# Patient Record
Sex: Female | Born: 1985 | Race: White | Hispanic: No | Marital: Single | Smoking: Never smoker
Health system: Southern US, Community
[De-identification: ages and names within clinical notes are randomized; demographics above are authoritative.]

## PROBLEM LIST (undated history)

## (undated) ENCOUNTER — Inpatient Hospital Stay: Admission: EM | Payer: Self-pay | Source: Home / Self Care

## (undated) DIAGNOSIS — J302 Other seasonal allergic rhinitis: Secondary | ICD-10-CM

## (undated) DIAGNOSIS — K219 Gastro-esophageal reflux disease without esophagitis: Secondary | ICD-10-CM

## (undated) DIAGNOSIS — Z86718 Personal history of other venous thrombosis and embolism: Secondary | ICD-10-CM

## (undated) DIAGNOSIS — J690 Pneumonitis due to inhalation of food and vomit: Secondary | ICD-10-CM

## (undated) DIAGNOSIS — Z972 Presence of dental prosthetic device (complete) (partial): Secondary | ICD-10-CM

## (undated) DIAGNOSIS — M1990 Unspecified osteoarthritis, unspecified site: Secondary | ICD-10-CM

## (undated) HISTORY — DX: Pneumonitis due to inhalation of food and vomit: J69.0

## (undated) HISTORY — DX: Gastro-esophageal reflux disease without esophagitis: K21.9

---

## 2009-08-30 ENCOUNTER — Ambulatory Visit: Payer: Self-pay | Admitting: Pulmonary Disease

## 2009-08-30 ENCOUNTER — Inpatient Hospital Stay (HOSPITAL_COMMUNITY): Admission: AD | Admit: 2009-08-30 | Discharge: 2009-09-02 | Payer: Self-pay | Admitting: Pulmonary Disease

## 2009-08-31 IMAGING — CR DG CHEST 1V PORT
1 series · 1 of 1 positions shown · non-contrast
Comparison: None.

CLINICAL DATA: Respiratory failure.

PORTABLE CHEST - 1 VIEW

[view not recorded]
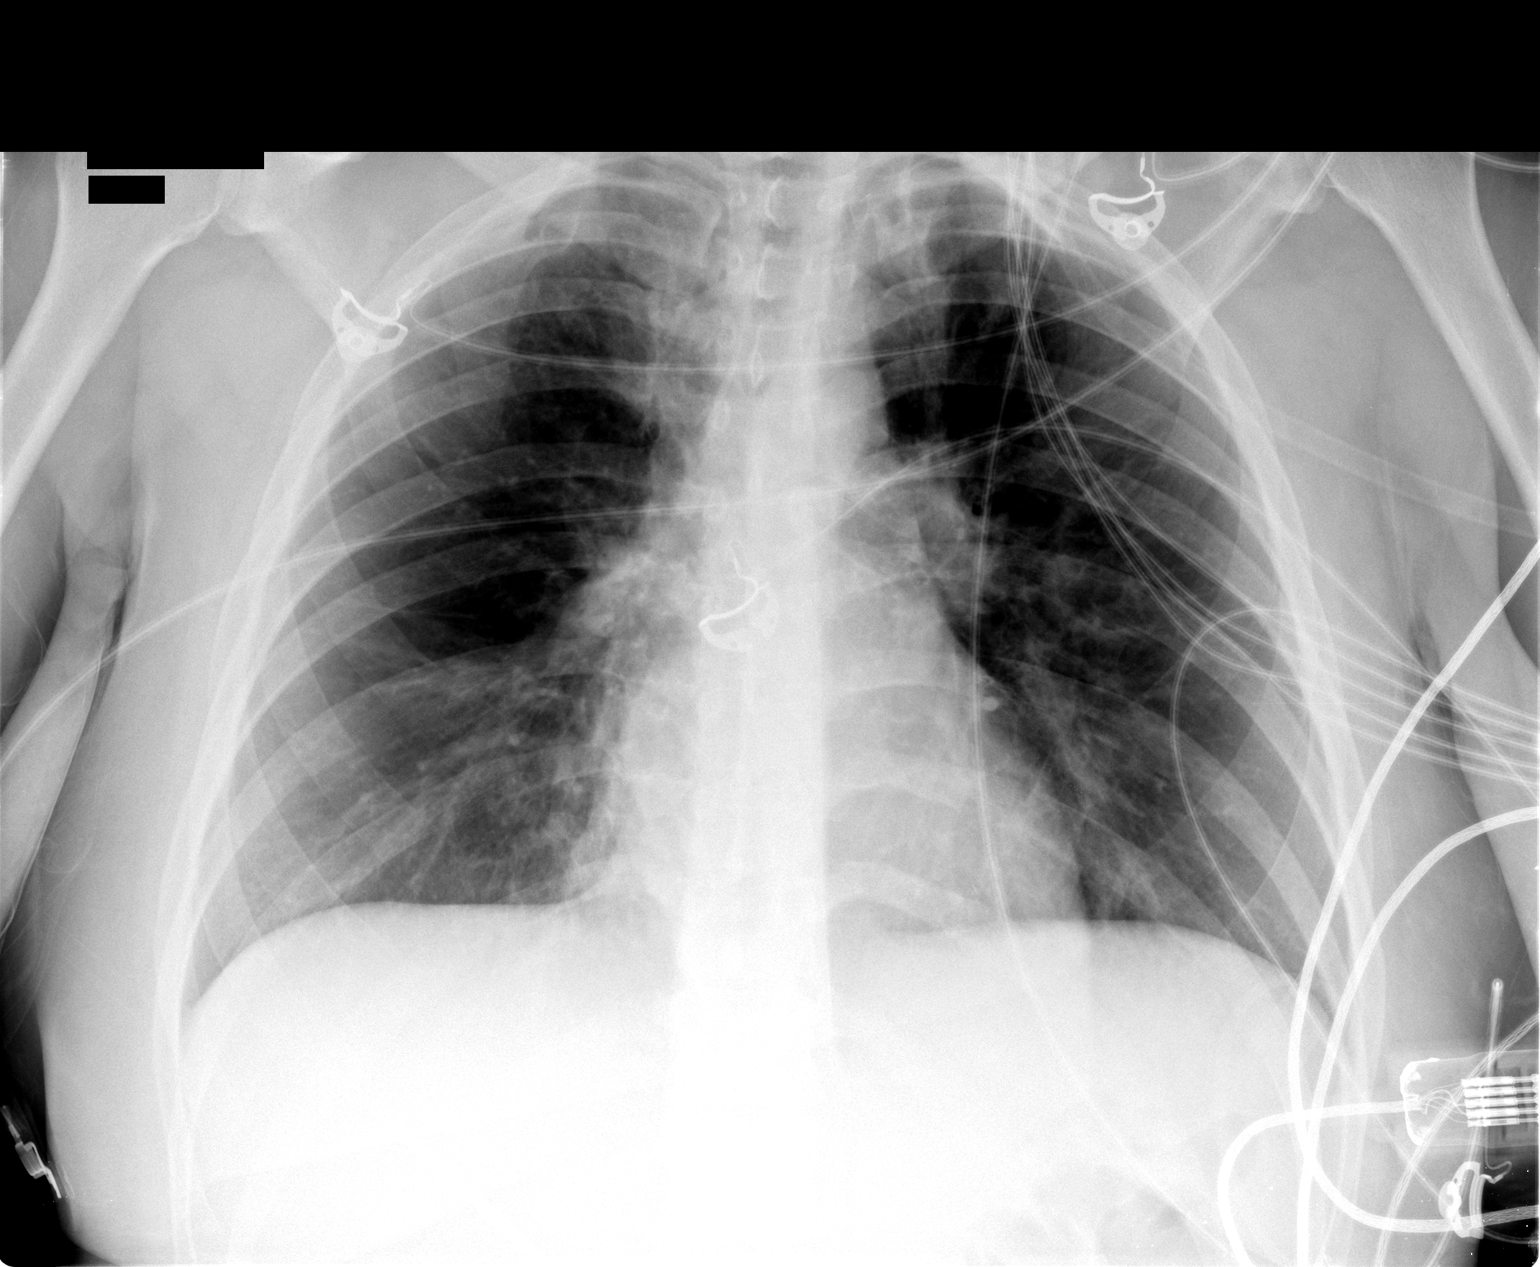

[1 of 1 positions shown; findings below may reference images not displayed]

FINDINGS: Monitoring leads are projected over the chest.  Streaky
medial upper lobe opacities are present as well as medial bibasilar
opacities.  The pattern is suggestive of aspiration pneumonitis.
Cardiopericardial silhouette and mediastinal contours appear within
normal limits.  No pleural fluid.  No pneumothorax.
IMPRESSION: Streaky medial apical and basilar opacities suspicious for
aspiration pneumonitis.

## 2009-09-01 IMAGING — CR DG CHEST 1V PORT
1 series · 1 of 1 positions shown · non-contrast
Comparison: [DATE]

CLINICAL DATA: Respiratory distress

PORTABLE CHEST - 1 VIEW

[view not recorded]
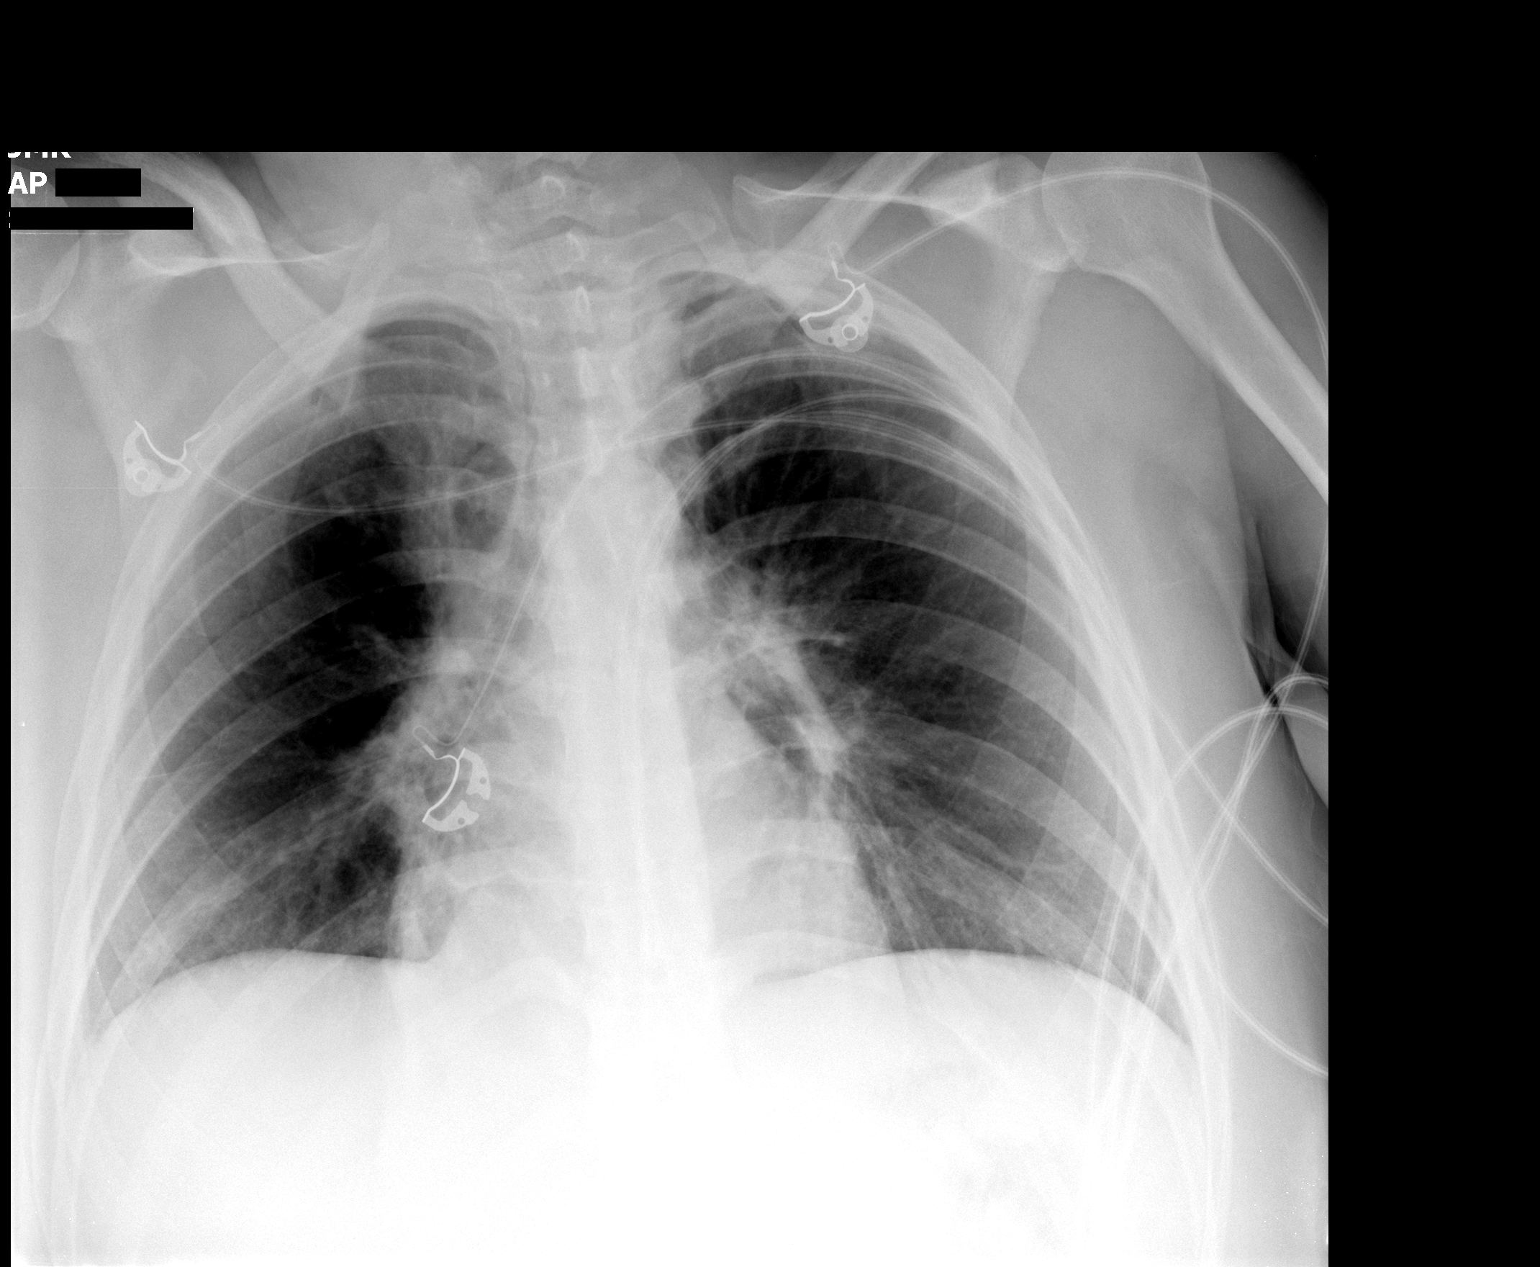

[1 of 1 positions shown; findings below may reference images not displayed]

FINDINGS: Decreased lung volume with slight increase in bibasilar
atelectasis.  There is no heart failure or pneumonia.
IMPRESSION: Increase in bibasilar and perihilar atelectasis.

## 2009-09-02 ENCOUNTER — Telehealth: Payer: Self-pay | Admitting: Emergency Medicine

## 2009-09-20 DIAGNOSIS — K219 Gastro-esophageal reflux disease without esophagitis: Secondary | ICD-10-CM | POA: Insufficient documentation

## 2009-09-20 DIAGNOSIS — J690 Pneumonitis due to inhalation of food and vomit: Secondary | ICD-10-CM

## 2009-09-20 DIAGNOSIS — J45909 Unspecified asthma, uncomplicated: Secondary | ICD-10-CM | POA: Insufficient documentation

## 2009-09-23 ENCOUNTER — Ambulatory Visit: Payer: Self-pay | Admitting: Emergency Medicine

## 2009-09-25 ENCOUNTER — Telehealth (INDEPENDENT_AMBULATORY_CARE_PROVIDER_SITE_OTHER): Payer: Self-pay | Admitting: *Deleted

## 2009-10-28 ENCOUNTER — Ambulatory Visit: Payer: Self-pay | Admitting: Emergency Medicine

## 2009-11-01 ENCOUNTER — Telehealth (INDEPENDENT_AMBULATORY_CARE_PROVIDER_SITE_OTHER): Payer: Self-pay | Admitting: *Deleted

## 2009-11-06 ENCOUNTER — Telehealth (INDEPENDENT_AMBULATORY_CARE_PROVIDER_SITE_OTHER): Payer: Self-pay | Admitting: *Deleted

## 2009-11-19 ENCOUNTER — Telehealth (INDEPENDENT_AMBULATORY_CARE_PROVIDER_SITE_OTHER): Payer: Self-pay | Admitting: *Deleted

## 2009-12-05 ENCOUNTER — Ambulatory Visit: Payer: Self-pay | Admitting: Emergency Medicine

## 2010-01-31 ENCOUNTER — Telehealth: Payer: Self-pay | Admitting: Emergency Medicine

## 2010-02-25 NOTE — Progress Notes (Signed)
Summary: chest pains -  stop Qvar  Phone Note Call from Patient   Caller: Patient Call For: byrum Summary of Call: pt having chest pains  want to know if inhaler could be causing the problem. Initial call taken by: Rickard Patience,  November 01, 2009 1:25 PM  Follow-up for Phone Call        [email or web address]. Carron Curie CMA  November 01, 2009 3:10 PM  Pt was seen by RB on 10.3.11 and started on qvar 2 puffs two times a day.  Pt states for the past 4 mornings she is waking up with right shoulder pain that goes into chest.  Pain in 6-9/10 and hurts "really, really bad."  States pain feels like heartburn and chest pressure.  Pain goes away after approx 2 hours of being up.  States she sleeps on her belly and the pain in mainly in mornings.  Would like to know if this could be coming from qvar and recs.  RB out of the office, Dr. Shelle Iron, pls advise.  Thanks!  Follow-up by: Gweneth Dimitri RN,  November 01, 2009 3:59 PM  Additional Follow-up for Phone Call Additional follow up Details #1::        not coming from qvar.  try sleeping on her back instead of stomach.  can also try prilosec otc one a day for possible heartburn. Additional Follow-up by: Barbaraann Share MD,  November 01, 2009 5:14 PM    Additional Follow-up for Phone Call Additional follow up Details #2::    [email or web address]  Gweneth Dimitri RN  November 01, 2009 5:16 PM  pt called back.  informed her of KC's recs.  Pt states she already has been taking Prilosec OTC once daily x 3 months approx.  And she is already sleeping propped up on her back (although the previous message Crystal took states pt sleeps on her belly) Will forward message to RB for recs. Arman Filter LPN  November 04, 2009 9:02 AM     Not clear to me that this is related to the QVAR, but she should stop it for now to see if this helps. If she improves then we can discuss restarting or possibly choosing an alternative. She should call us to let us know how she does off the med.  Leslye Peer MD  November 04, 2009 10:05 AM   Follow-up by: Leslye Peer MD,  November 04, 2009 10:05 AM  Additional Follow-up for Phone Call Additional follow up Details #3:: Details for Additional Follow-up Action Taken: called and spoke with pt. informed her of RB's response.  Pt states she will stop Qvar and call us in 1 week with a response.  Aundra Millet Reynolds LPN  November 04, 2009 10:16 AM

## 2010-02-25 NOTE — Progress Notes (Signed)
Summary: prescription issue  Phone Note Call from Patient Call back at Home Phone [phone]   Caller: Patient//jennifer Call For: byrum Summary of Call: States daughter was just discharged and Dr. Delton Coombes prescribed protonix 40mg , she can't afford it wants to know if he can substitute OTC med, pls advise.//[facility] Initial call taken by: Darletta Moll,  September 02, 2009 1:38 PM  Follow-up for Phone Call        [facility] Vernie Murders  September 02, 2009 1:52 PM wanted to make rb aware they are using omeprazole 20mg  once daily because they have no insurance and can not affor the protonix because the cost was 198.00 dollars, if this is not ok to try first pls let them know otherwise this is what they are using due to cost it was suggested by the pharmacy because they thought using something would be better than nothing at all. if using the omeprazole is ok they do not need a phone call back Follow-up by: Philipp Deputy CMA,  September 02, 2009 2:14 PM  Additional Follow-up for Phone Call Additional follow up Details #1::        This is fine, she can use the omeprazole and we can discuss effectiveness at hosp f/u visit Additional Follow-up by: Leslye Peer MD,  September 03, 2009 2:24 PM    Additional Follow-up for Phone Call Additional follow up Details #2::    LMOMTCB x 1. Zackery Barefoot CMA  September 03, 2009 2:33 PM   [facility] x 2 Zackery Barefoot CMA  September 04, 2009 9:24 AM   Victorino Dike returned call, I advised her of RB recs, per pt request mailed GERD diet to home address. Pt states she is having to take an additional Omeprazole. I advied to take Take 1 tablet by mouth once a day and one additional as needed. Zackery Barefoot CMA  September 04, 2009 11:36 AM

## 2010-02-25 NOTE — Progress Notes (Signed)
Summary: FYI update on breathing  LMTCBx1  Phone Note Call from Patient   Caller: Patient Call For: byrum Summary of Call: pt states that she is using inhaler in the am and not having any problems with it. Initial call taken by: Rickard Patience,  November 06, 2009 11:17 AM  Follow-up for Phone Call        [facility].  Aundra Millet Reynolds LPN  November 06, 2009 11:42 AM   called and spoke with pt.  pt states she didn't completely stop qvar as instructed by RB per last phone note from 11/01/2009.  Pt states she takes just 1 puff each morning.  She states her "breathing is fine" throughout the day and doesn't need to use rescue hfa when she does this.  She just wanted to verify this was ok with RB.  Will forward message to him to address.  Aundra Millet Reynolds LPN  November 07, 2009 2:12 PM   Additional Follow-up for Phone Call Additional follow up Details #1::        Yes this is ok, continue this dosing until we see each other. Leslye Peer MD  November 08, 2009 10:44 AM  Additional Follow-up by: Leslye Peer MD,  November 08, 2009 10:44 AM    Additional Follow-up for Phone Call Additional follow up Details #2::    LMOMTCBX1.  Just need to inform pt of RB's response.  i've already updated her med list to reflect this change.  Aundra Millet Reynolds LPN  November 08, 2009 11:00 AM   pt aware of dr byrums response Follow-up by: Lacinda Axon,  November 08, 2009 11:06 AM  New/Updated Medications: QVAR 80 MCG/ACT AERS (BECLOMETHASONE DIPROPIONATE) inhale 1 puff once each morning

## 2010-02-25 NOTE — Assessment & Plan Note (Signed)
Summary: asthma   Visit Type:  Follow-up Copy to:  David Stall  CC:  Asthma...breathing has improved on Qvar...only using once daily in the morning ...she c/o chest pain after using it in the evenings...rescue inhaler use has decreased to every 3-4 days vs everyday.  History of Present Illness: 25 yo woman, never smoker, dx with asthma as a child. She had to use SABA as a teen when she played softball. Was admited to [facility] in transfer from [facility] 08/2009 for asthma exac and suspected aspiration pneumonitis associated with hypoxemia. Also w a hx of GERD. Has done fairly well since discharge. She averages using SABA once a day. On at least one occasion she has waked up from deep sleep SOB. This was her first flare that caused hospitalization. Her symptoms feel like someone "squeezing her chest". Happens when weather changes temps or a few hours after she plays softball.   ROV 10/28/09 -- Returns for asthma. Last time we planned to pre-treat exercise and sports. Developed some URI symptoms about 3 weeks ago, no real fever nasal drainage etc.  Can wake her from sleep and respondes to SABA. Had one episode where she had trouble playing softball even after pre-treating.   ROV 12/05/09 -- f/u for her asthma. Last time we started QVAR 80. She has been taking in morning, not at night because she was having chest pain, burning, ? GERD. Using QVAR in the am has decreased her SABA use. She feels better on it.   Current Medications (verified): 1)  Proair Hfa 108 (90 Base) Mcg/act Aers (Albuterol Sulfate) .... 2 Puffs Every 4 Hours As Needed 2)  Omeprazole 20 Mg Cpdr (Omeprazole) .Marland Kitchen.. 1 By Mouth Daily, Can Take Twice Daily As Needed 3)  Qvar 80 Mcg/act Aers (Beclomethasone Dipropionate) .... Inhale 1 Puff Once Each Morning  Allergies (verified): No Known Drug Allergies  Vital Signs:  Patient profile:   25 year old female Height:      65 inches (165.10 cm) Weight:      196 pounds (89.09 kg) BMI:      32.73 O2 Sat:      99 % on Room air Temp:     98.2 degrees F (36.78 degrees C) oral Pulse rate:   88 / minute BP sitting:   124 / 86  (left arm) Cuff size:   regular  Vitals Entered By: Michel Bickers CMA (December 05, 2009 10:53 AM)  O2 Sat at Rest %:  99 O2 Flow:  Room air CC: Asthma...breathing has improved on Qvar...only using once daily in the morning ...she c/o chest pain after using it in the evenings...rescue inhaler use has decreased to every 3-4 days vs everyday Comments Medications reviewed with patient Michel Bickers CMA  December 05, 2009 10:53 AM   Physical Exam  General:  normal appearance and healthy appearing.   Head:  normocephalic and atraumatic Eyes:  conjunctiva and sclera clear Nose:  no deformity, discharge, inflammation, or lesions Mouth:  no deformity or lesions Neck:  no masses, thyromegaly, or abnormal cervical nodes Lungs:  clear bilaterally to auscultation and percussion Heart:  regular rate and rhythm, S1, S2 without murmurs, rubs, gallops, or clicks Abdomen:  not examined Msk:  no deformity or scoliosis noted with normal posture Extremities:  no clubbing, cyanosis, edema, or deformity noted Neurologic:  non-focal Skin:  intact without lesions or rashes Psych:  alert and cooperative; normal mood and affect; normal attention span and concentration   Impression & Recommendations:  Problem # 1:  ASTHMA (ICD-493.90) Continue qvar am. If we need to escalate the qvar, may need to also escalate rx for GERD ProAir as needed, will likely need to use prior to softball games rov 6 months or as needed   Problem # 2:  G E R D (ICD-530.81)  Her updated medication list for this problem includes:    Omeprazole 20 Mg Cpdr (Omeprazole) .Marland Kitchen... 1 by mouth daily, can take twice daily as needed  Other Orders: Est. Patient Level III (16109)  Patient Instructions: 1)  Please continue your QVAR , 1 puff every morning.  2)  Use your ProAir as needed  3)  Follow  up with Dr Delton Coombes in 6 months or as needed  Prescriptions: QVAR 80 MCG/ACT AERS (BECLOMETHASONE DIPROPIONATE) inhale 1 puff once each morning  #1 x 11   Entered and Authorized by:   Leslye Peer MD   Signed by:   Leslye Peer MD on 12/05/2009   Method used:   [facility]        [phone][facility]ail[facility]       [address]       [place], [place]  [address]       Ph: [phone] or [phone]       Fax: [phone]   RxID:   [id number]

## 2010-02-25 NOTE — Progress Notes (Signed)
Summary: team sports question/ asthma  Phone Note Call from Patient Call back at Home Phone [phone]   Caller: Patient Call For: BYRUM Summary of Call: pt wants to play volleyball- is this ok?  Initial call taken by: Tivis Ringer, CNA,  September 25, 2009 10:30 AM  Follow-up for Phone Call        Spoke with pt.  She states that she wants to start playing volleyball in about 2 wks, already plays softball and she is afraid that this might be too much for her and exacerbate asthma symptoms.  Pt aware RB out of the office until 09/26/09.  Pls advise thanks! Follow-up by: Vernie Murders,  September 25, 2009 10:39 AM  Additional Follow-up for Phone Call Additional follow up Details #1::        I think this is OK, but she needs to pre-treat her games with albuterol 2 puffs (just like she would softball). Leslye Peer MD  September 26, 2009 5:39 PM  Additional Follow-up by: Leslye Peer MD,  September 26, 2009 5:39 PM    Additional Follow-up for Phone Call Additional follow up Details #2::    Spoke with pt and notified of recs per RB.  Pt verbalized understanding. Follow-up by: Vernie Murders,  September 26, 2009 5:42 PM

## 2010-02-25 NOTE — Assessment & Plan Note (Signed)
Summary: asthma   Visit Type:  Follow-up Copy to:  David Stall  CC:  Asthma follow-up with PFT's...the patient c/o increased SOB and a dry cough with the change in weather.  History of Present Illness: 25 yo woman, never smoker, dx with asthma as a child. She had to use SABA as a teen when she played softball. Was admited to [facility] in transfer from [facility] 08/2009 for asthma exac and suspected aspiration pneumonitis associated with hypoxemia. Also w a hx of GERD. Has done fairly well since discharge. She averages using SABA once a day. On at least one occasion she has waked up from deep sleep SOB. This was her first flare that caused hospitalization. Her symptoms feel like someone "squeezing her chest". Happens when weather changes temps or a few hours after she plays softball.   ROV 10/28/09 -- Returns for asthma. Last time we planned to pre-treat exercise and sports. Developed some URI symptoms about 3 weeks ago, no real fever nasal drainage etc.  Can wake her from sleep and respondes to SABA. Had one episode where she had trouble playing softball even after pre-treating.   Current Medications (verified): 1)  Proair Hfa 108 (90 Base) Mcg/act Aers (Albuterol Sulfate) .... 2 Puffs Every 4 Hours As Needed 2)  Omeprazole 20 Mg Cpdr (Omeprazole) .Marland Kitchen.. 1 By Mouth Daily, Can Take Twice Daily As Needed  Allergies (verified): No Known Drug Allergies  Vital Signs:  Patient profile:   25 year old female Height:      65 inches (165.10 cm) Weight:      192 pounds (87.27 kg) BMI:     32.07 O2 Sat:      96 % on Room air Temp:     97.9 degrees F (36.61 degrees C) oral Pulse rate:   109 / minute BP sitting:   116 / 84  (left arm) Cuff size:   regular  Vitals Entered By: Michel Bickers CMA (October 28, 2009 11:10 AM)  O2 Sat at Rest %:  96 O2 Flow:  Room air CC: Asthma follow-up with PFT's...the patient c/o increased SOB and a dry cough with the change in weather Comments Medications reviewed with  patient Daytime phone verified. Michel Bickers [facility]  October 28, 2009 11:11 AM    Pulmonary Function Test Date: 10/28/2009 Height (in.): 63 Gender: Female  Pre-Spirometry FVC    Value: 3.62 L/min   Pred: 3.77 L/min     % Pred: 96 % FEV1    Value: 2.35 L     Pred: 3.02 L     % Pred: 78 % FEV1/FVC  Value: 65 %     Pred: 80 %    FEF 25-75  Value: 1.21 L/min   Pred: 3.55 L/min     % Pred: 34 %  Post-Spirometry FVC    Value: 3.55 L/min   Pred: 3.77 L/min     % Pred: 94 % FEV1    Value: 2.49 L     Pred: 3.02 L     % Pred: 82 % FEV1/FVC  Value: 70 %     Pred: 80 %    FEF 25-75  Value: 1.63 L/min   Pred: 3.55 L/min     % Pred: 46 %  Lung Volumes TLC    Value: 4.00 L   % Pred: 81 % RV    Value: 0.39 L   % Pred: 27 % DLCO    Value: 21.2 %   % Pred:  79 % DLCO/VA  Value: 6.09 %   % Pred: 134 %  Comments: Moderate AFL, no significant change after BD. ? restriction of volumes, normal DLCO. RSB  Impression & Recommendations:  Problem # 1:  ASTHMA (ICD-493.90) Frequent breakthru symptoms, even after pre-treating exercise.  - start ICS today - QVAR 80 two times a day  - as needed SABA and pre-treat exercise.  - offered flu shot, she declined  Medications Added to Medication List This Visit: 1)  Omeprazole 20 Mg Cpdr (Omeprazole) .Marland Kitchen.. 1 by mouth daily, can take twice daily as needed  Other Orders: Est. Patient Level IV (57846)  Patient Instructions: 1)  We will start QVAR , 1 puff two times a day  2)  Continue to use ProAir up to every 4 hours if needed for shortness of breath, and to pre-treat 15 minutes before your exercise.  3)  Follow up with Dr Delton Coombes in 6 weeks or as needed  4)  You would probably benefit from the flu shot. Call our office to get this if you change your mind.    Immunization History:  Pneumovax Immunization History:    Pneumovax:  historical (09/02/2009)

## 2010-02-25 NOTE — Miscellaneous (Signed)
Summary: Orders Update pft charges  Clinical Lists Changes  Orders: Added new Service order of Carbon Monoxide diffusing w/capacity (94720) - Signed Added new Service order of Lung Volumes (94240) - Signed Added new Service order of Spirometry (Pre & Post) (94060) - Signed 

## 2010-02-25 NOTE — Progress Notes (Signed)
Summary: rx-proair  Phone Note From Pharmacy   Caller: [facility] #[id number]*--[phone]--Kenzel Ruesch Call For: clance  Reason for Call: Needs renewal Summary of Call: [facility] not [facility] any refills on proair.  Requesting refill Initial call taken by: Lehman Prom,  November 19, 2009 2:06 PM  Follow-up for Phone Call        pt is RB's patient; it appears proair was given when [facility] was on-call one weekend.  [facility] gave verbal to [facility] for proair refills x2, to be put under RB's name from now on. Boone Master CNA/MA  November 19, 2009 2:15 PM     Prescriptions: PROAIR HFA 108 (90 BASE) MCG/ACT AERS (ALBUTEROL SULFATE) 2 puffs every 4 hours as needed  #1 x 2   Entered by:   Boone Master CNA/MA   Authorized by:   Leslye Peer MD   Signed by:   Boone Master CNA/MA on 11/19/2009   Method used:   Telephoned to ...       [facility] [phone][facility] (retail)       [address]       [place], [place]  [address]       Ph: [phone] or [phone]       Fax: [phone]   RxID:   [id number]

## 2010-02-25 NOTE — Assessment & Plan Note (Signed)
Summary: asthma, GERD   Visit Type:  Hospital Follow-up Copy to:  David Stall  CC:  HFU for asthma and hypoxia.  The patient c/o chest tightness...she denies any cough or SOB or wheezing.  History of Present Illness: 25 yo woman, never smoker, dx with asthma as a child. She had to use SABA as a teen when she played softball. Was admited to [facility] in transfer from [facility] 08/2009 for asthma exac and suspected aspiration pneumonitis associated with hypoxemia. Also w a hx of GERD. Has done fairly well since discharge. She averages using SABA once a day. On at least one occasion she has waked up from deep sleep SOB. This was her first flare that caused hospitalization. Her symptoms feel like someone "squeezing her chest". Happens when weather changes temps or a few hours after she plays softball.   Preventive Screening-Counseling & Management  Alcohol-Tobacco     Alcohol drinks/day: 0     Smoking Status: never  Current Medications (verified): 1)  Proair Hfa 108 (90 Base) Mcg/act Aers (Albuterol Sulfate) .... 2 Puffs Every 4 Hours As Needed 2)  Tessalon Perles 100 Mg Caps (Benzonatate) .Marland Kitchen.. 1 By Mouth Every 8 Hours As Needed For Cough  Allergies (verified): No Known Drug Allergies  Past History:  Past Surgical History: none  Family History: Family History MI/Heart Attack---MGM Family History Asthma---father and paternal uncle Family HIstory Thyroid Disease---MGM  Social History: Patient never smoked.  No ETOH use Works at [employer], has done outdoor camps, afterschool care.  Lives w her Mom Smoking Status:  never Alcohol drinks/day:  0  Review of Systems       The patient complains of non-productive cough.  The patient denies shortness of breath with activity, shortness of breath at rest, productive cough, coughing up blood, chest pain, irregular heartbeats, acid heartburn, indigestion, loss of appetite, weight change, abdominal pain, difficulty swallowing, sore throat, tooth/dental  problems, headaches, nasal congestion/difficulty breathing through nose, sneezing, itching, ear ache, anxiety, depression, hand/feet swelling, joint stiffness or pain, rash, change in color of mucus, and fever.         Reliably gets cough in the early fall months, no real PND.   Vital Signs:  Patient profile:   25 year old female Height:      65 inches (165.10 cm) Weight:      189 pounds (85.91 kg) BMI:     31.56 O2 Sat:      97 % on Room air Temp:     98.2 degrees F (36.78 degrees C) oral Pulse rate:   93 / minute BP sitting:   110 / 68  (left arm) Cuff size:   regular  Vitals Entered By: Michel Bickers CMA (September 23, 2009 2:53 PM)  O2 Sat at Rest %:  97 O2 Flow:  Room air CC: HFU for asthma and hypoxia.  The patient c/o chest tightness...she denies any cough or SOB or wheezing Is Patient Diabetic? No Comments Medications reviewed with the patient. Daytime phone verified. Michel Bickers [facility]  September 23, 2009 2:54 PM   Physical Exam  General:  normal appearance and healthy appearing.   Head:  normocephalic and atraumatic Eyes:  conjunctiva and sclera clear Nose:  no deformity, discharge, inflammation, or lesions Mouth:  no deformity or lesions Neck:  no masses, thyromegaly, or abnormal cervical nodes Lungs:  clear bilaterally to auscultation and percussion Heart:  regular rate and rhythm, S1, S2 without murmurs, rubs, gallops, or clicks Abdomen:  not examined Msk:  no deformity or scoliosis noted with normal posture Extremities:  no clubbing, cyanosis, edema, or deformity noted Neurologic:  non-focal Skin:  intact without lesions or rashes Psych:  alert and cooperative; normal mood and affect; normal attention span and concentration   Impression & Recommendations:  Problem # 1:  ASTHMA (ICD-493.90) Continue to have your Proventil available to use up to every 4 hours as needed for shortness of breath.  Take your Proventil 2 puffs 20 minutes prior to your softball games.  We  will perform full pulmonary function testing You should try to cut down on your caffeine use.  We will consider starting an every-day medication for asthma at your next visit.  Problem # 2:  G E R D (ICD-530.81) Continue your omeprazole once daily   Other Orders: Consultation Level IV (18841)  Patient Instructions: 1)  Continue your omeprazole once daily  2)  Continue to have your Proventil available to use up to every 4 hours as needed for shortness of breath.  3)  Take your Proventil 2 puffs 20 minutes prior to your softball games.  4)  We will perform full pulmonary function testing 5)  You should try to cut down on your caffeine use.  6)  We will consider starting an every-day medication for asthma at your next visit.

## 2010-02-27 NOTE — Progress Notes (Signed)
Summary: prescription-LMTCBx1  Phone Note Call from Patient Call back at Home Phone [phone]   Caller: Patient Call For: Dr. Delton Coombes Summary of Call: Patient phoned the sample of Qvar help her a lot but it was $149.50 with the discount card and she doesn't have insurance. She wants to know if there was an inhailer less expensive than the Qvar 80 mg that she could try. She can be reached at [phone] Initial call taken by: Vedia Coffer,  January 31, 2010 9:16 AM  Follow-up for Phone Call        Lmtcbx1. to offer pt samples until RB is back in office to advise on change.  Carron Curie CMA  January 31, 2010 9:49 AM  I have given pt sample of qvar . I advsied I will forward message to RB to address on possible alternative. Carron Curie CMA  January 31, 2010 10:18  Can we get financial assisatance for QVAR? Or alternatively, can we find out the cheapest inhaled corticosteroid (or one that has financial assistance?) Thanks you. Leslye Peer MD  February 04, 2010 8:51 PM    Follow-up by: Leslye Peer MD,  February 04, 2010 8:51 PM  Additional Follow-up for Phone Call Additional follow up Details #1::        We do have financial assistance forms for qvar- I have filled out our portion of the form with the exception of RB's signature.  I will have him sign this PM. Spoke with pt and advised her of this.  She will come and pick up the form tommorrow and pt states that she still has plenty of her sample left so this is not needed.    Additional Follow-up by: Vernie Murders,  February 05, 2010 9:22 AM    Additional Follow-up for Phone Call Additional follow up Details #2::    Thanks very much I will sign the form Leslye Peer MD  February 05, 2010 4:44 PM  Follow-up by: Leslye Peer MD,  February 05, 2010 4:44 PM  Additional Follow-up for Phone Call Additional follow up Details #3:: Details for Additional Follow-up Action Taken: leslie--do you still have these  forms? Randell Loop [facility]  February 05, 2010 4:49 PM

## 2010-03-04 ENCOUNTER — Telehealth: Payer: Self-pay | Admitting: Emergency Medicine

## 2010-03-06 ENCOUNTER — Encounter: Payer: Self-pay | Admitting: Adult Health

## 2010-03-06 ENCOUNTER — Ambulatory Visit (INDEPENDENT_AMBULATORY_CARE_PROVIDER_SITE_OTHER)
Admission: RE | Admit: 2010-03-06 | Discharge: 2010-03-06 | Disposition: A | Discharge status: Home / Self Care | Payer: Self-pay | Source: Ambulatory Visit | Attending: Emergency Medicine | Admitting: Emergency Medicine

## 2010-03-06 ENCOUNTER — Telehealth: Payer: Self-pay | Admitting: Adult Health

## 2010-03-06 ENCOUNTER — Ambulatory Visit (INDEPENDENT_AMBULATORY_CARE_PROVIDER_SITE_OTHER): Payer: Self-pay | Admitting: Adult Health

## 2010-03-06 ENCOUNTER — Other Ambulatory Visit: Payer: Self-pay | Admitting: Emergency Medicine

## 2010-03-06 DIAGNOSIS — K219 Gastro-esophageal reflux disease without esophagitis: Secondary | ICD-10-CM

## 2010-03-06 DIAGNOSIS — R0789 Other chest pain: Secondary | ICD-10-CM

## 2010-03-06 DIAGNOSIS — J45909 Unspecified asthma, uncomplicated: Secondary | ICD-10-CM

## 2010-03-06 IMAGING — CR DG CHEST 2V
2 series · 2 of 2 positions shown · non-contrast
Comparison: [DATE]

CLINICAL DATA: Chest pain and shortness of breath.

CHEST - 2 VIEW

[view not recorded (1 of 2)]
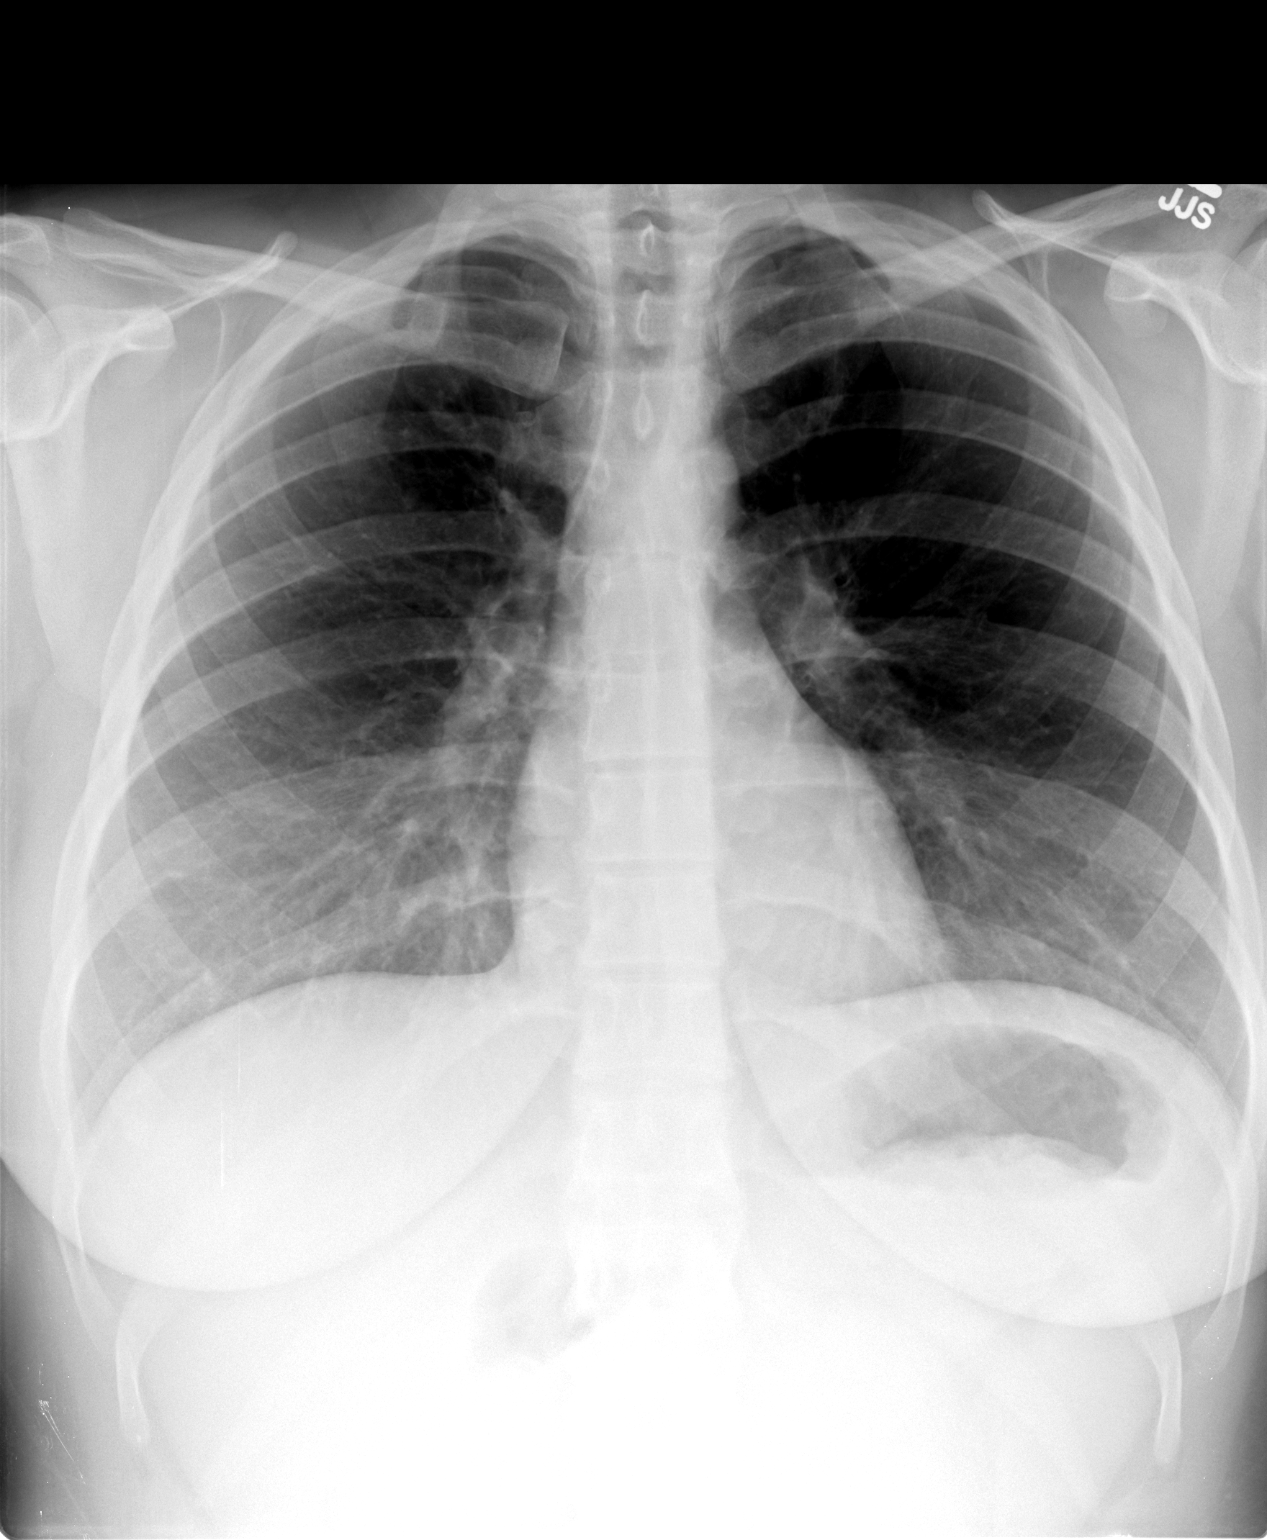

[view not recorded (2 of 2)]
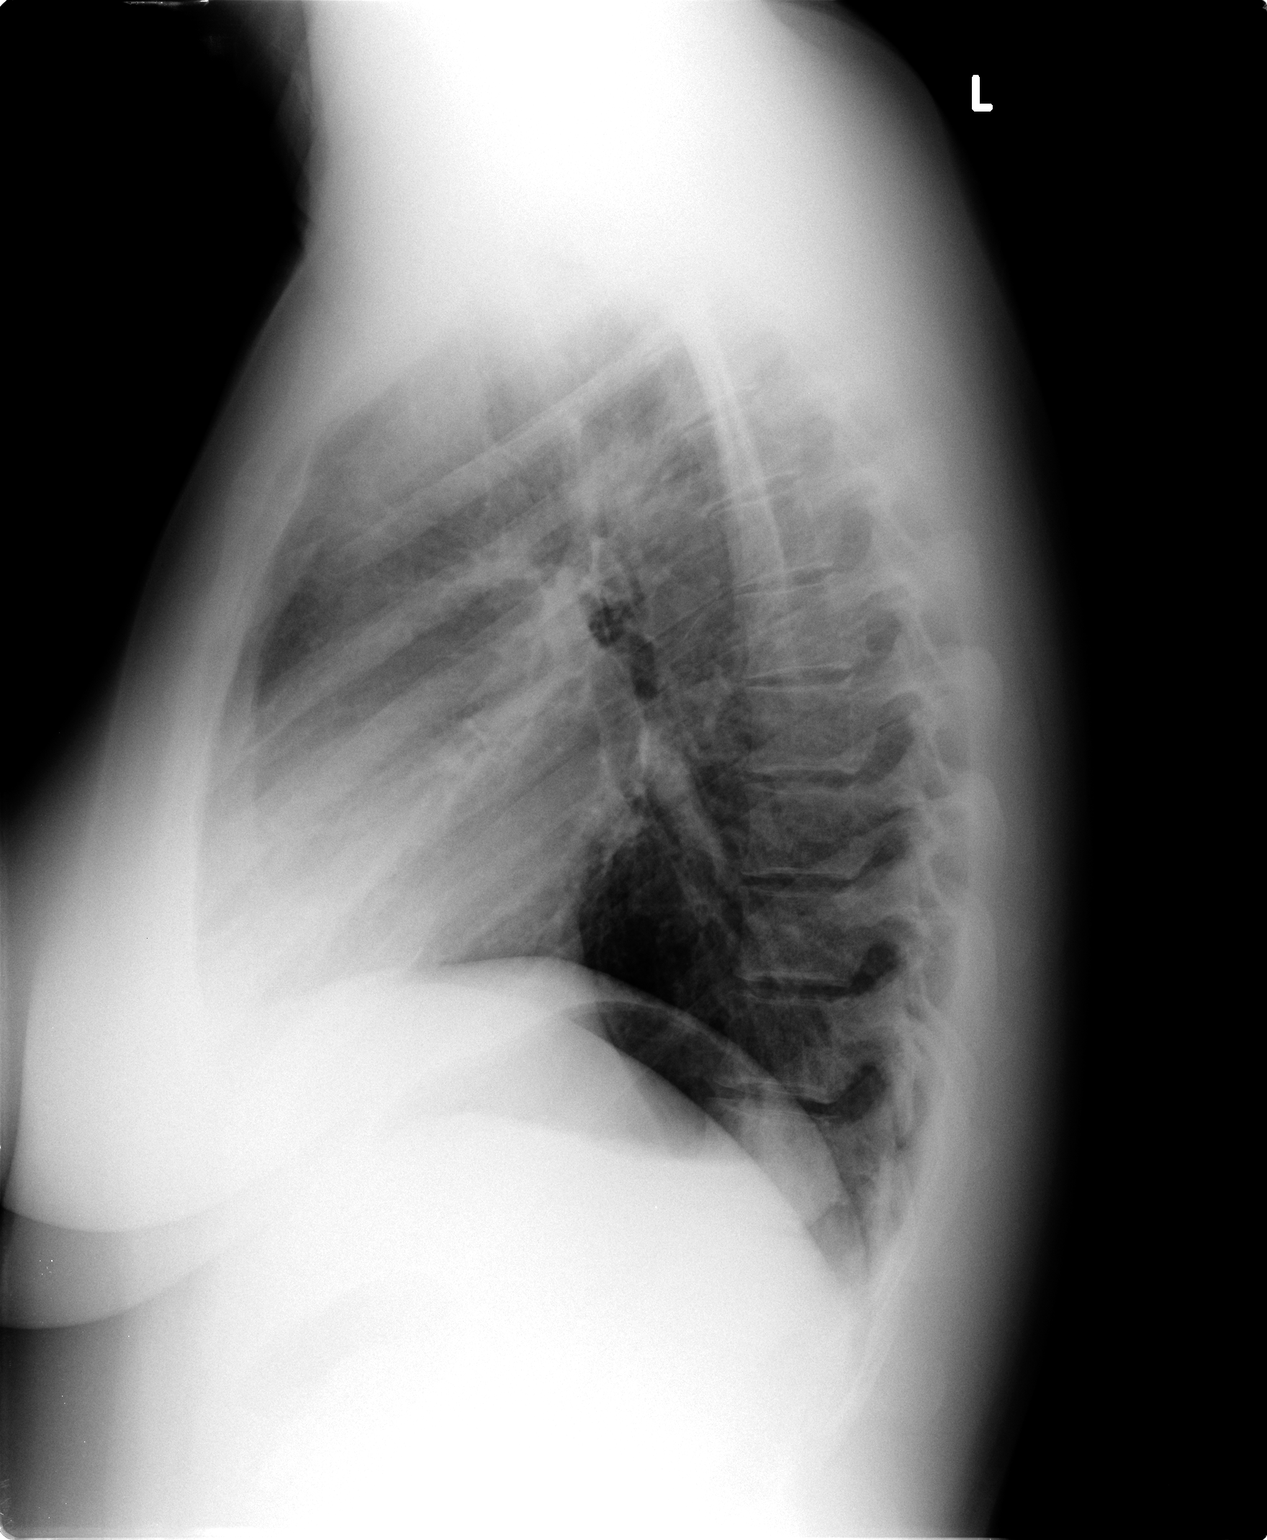

[2 of 2 positions shown; findings below may reference images not displayed]

FINDINGS: The cardiomediastinal silhouette is unremarkable.
Mild peribronchial thickening again noted.
There is no evidence of focal airspace disease, pulmonary edema,
pulmonary nodule/mass, pleural effusion, or pneumothorax.
No acute bony abnormalities are identified.
IMPRESSION: No evidence of acute cardiopulmonary disease.

Mild chronic peribronchial thickening.

## 2010-03-07 ENCOUNTER — Telehealth: Payer: Self-pay | Admitting: Adult Health

## 2010-03-10 ENCOUNTER — Telehealth: Payer: Self-pay | Admitting: Adult Health

## 2010-03-13 NOTE — Progress Notes (Addendum)
Summary: Stabbing chest pain  Phone Note Call from Patient   Caller: Patient Summary of Call: Patient called to report stabbing pains in her chest.  She was seen for chest pain yesterday and was told she had heartburn.  Transferred call to Michel Bickers in Triage. Initial call taken by: Leonette Monarch,  March 07, 2010 10:47 AM  Follow-up for Phone Call        Pt states the chest pain is getting worse since OV on 03/06/2010. She has tried the Mylanta, Prilosec two times a day, using Qvar as instructed and watching her diet. She states it feels like someone is "stabbing" her in the chest and the pain settles into her right shoulder as well. Pt would like to know if there is anything else to try and ease the pain. RB out of the office and will send msg to TP since pt was seen by her in office on 03/06/2010. Pls advise. Follow-up by: Michel Bickers CMA,  March 07, 2010 10:56 AM  Additional Follow-up for Phone Call Additional follow up Details #1::        spoke with pt she is not feeling any better  has taken ppi two times a day and mylanta  pain is getting worse CXR with no acute changes  EKG with no acute findings.  she was advised to go to ER as this does not sound lke reflux will need further eval.  pt verbalized understanding    Additional Follow-up by: Tammy Parrett NP,  March 07, 2010 11:07 AM

## 2010-03-13 NOTE — Progress Notes (Signed)
Summary: heartburn  Phone Note Call from Patient Call back at Home Phone [phone]   Caller: Patient Call For: byrum Summary of Call: pt c/o "burning in middle of chest" that goes to her right shoulder. this has been going on all day. she has taken omeprazole. wants to know if she should take another or what does dr rec? no N or V. no blurred vision. pt did, however, eat eggs and sausage this morning but says she ate the same last week and didn't have heartburn at that time. [facility] [place] Initial call taken by: Tivis Ringer, CNA,  March 04, 2010 2:01 PM  Follow-up for Phone Call        Spoke wiht pt and she is c/o burning in her chest that has been going on all day today. She also c/o increased wheezing and chest tightness. She relates this to heartburn. i asked if she feel slike she is having heartburn symptoms and she states she has never had heartburn so shes not sure.  She then states she has had some SOB as well and has had to increase use of rescue inhaler. I advised pt that this might be start of infection so I advise an appt. pt offered appt tomorrow but she refused stating she has to work. Pt set to see TP on thursday at 9am. Carron Curie CMA  March 04, 2010 4:23 PM

## 2010-03-13 NOTE — Progress Notes (Signed)
Summary: speak to nurse  Phone Note Call from Patient Call back at Home Phone [phone]   Caller: Patient Call For: Etheline Geppert Reason for Call: Talk to Nurse Complaint: Urinary/GYN Problems Summary of Call: Pt wants to know where she can find a cook book for people with gerd. Initial call taken by: Darletta Moll,  March 06, 2010 3:29 PM  Follow-up for Phone Call        Advised pt to check at [employer] for cookbook. Carron Curie CMA  March 06, 2010 4:12 PM

## 2010-03-13 NOTE — Assessment & Plan Note (Addendum)
Summary: Acute NP office visit - heartburn/tachycardia   Copy to:  David Stall  CC:  intermittent burning in chest x2days and and states noticed tachycardia w/ rest x1day.  History of Present Illness: 25 yo woman, never smoker, dx with asthma as a child. She had to use SABA as a teen when she played softball. Was admited to [facility] in transfer from [facility] 08/2009 for asthma exac and suspected aspiration pneumonitis associated with hypoxemia. Also w a hx of GERD. Has done fairly well since discharge. She averages using SABA once a day. On at least one occasion she has waked up from deep sleep SOB. This was her first flare that caused hospitalization. Her symptoms feel like someone "squeezing her chest". Happens when weather changes temps or a few hours after she plays softball.   ROV 10/28/09 -- Returns for asthma. Last time we planned to pre-treat exercise and sports. Developed some URI symptoms about 3 weeks ago, no real fever nasal drainage etc.  Can wake her from sleep and respondes to SABA. Had one episode where she had trouble playing softball even after pre-treating.   ROV 12/05/09 -- f/u for her asthma. Last time we started QVAR 80. She has been taking in morning, not at night because she was having chest pain, burning, ? GERD. Using QVAR in the am has decreased her SABA use. She feels better on it.   March 06, 2010 --Presents for an acute office visit. Complains of intermittent burning in chest x 2 days, and states noticed tachycardia w/ rest x1day. Had severe episode of GERD w/suspected aspiration pneumonitis w/ admission in 08/2009. She says she has on/off burning in chest for last 4 months. Taking Prilosec once daily .Has had more intermittent wheezing and used her proair 3 x this past week. This am ate  sausage biscuit this morning with increased burning in chest. Denies orthopnea, hemoptysis, fever, n/v/d, edema, headache, calf pain, syncope , abdominal pain or bloody symptoms.    Medications Prior to Update: 1)  Proair Hfa 108 (90 Base) Mcg/act Aers (Albuterol Sulfate) .... 2 Puffs Every 4 Hours As Needed 2)  Omeprazole 20 Mg Cpdr (Omeprazole) .Marland Kitchen.. 1 By Mouth Daily, Can Take Twice Daily As Needed 3)  Qvar 80 Mcg/act Aers (Beclomethasone Dipropionate) .... Inhale 1 Puff Once Each Morning  Current Medications (verified): 1)  Proair Hfa 108 (90 Base) Mcg/act Aers (Albuterol Sulfate) .... 2 Puffs Every 4 Hours As Needed 2)  Omeprazole 20 Mg Cpdr (Omeprazole) .Marland Kitchen.. 1 By Mouth Daily, Can Take Twice Daily As Needed 3)  Qvar 80 Mcg/act Aers (Beclomethasone Dipropionate) .... Inhale 1 Puff Once Each Morning  Allergies (verified): 1)  ! Rocephin  Past History:  Past Medical History: Last updated: 09/20/2009 Current Problems:  ASPIRATION PNEUMONIA (ICD-507.0) G E R D (ICD-530.81) ASTHMA (ICD-493.90)  Family History: Last updated: 09/23/2009 Family History MI/Heart Attack---MGM Family History Asthma---father and paternal uncle Family HIstory Thyroid Disease---MGM  Social History: Last updated: 03/06/2010 Patient never smoked.  No ETOH use Works at [employer], has done outdoor camps, afterschool care.  Lives w her Mom declines in flu shot 03-06-10  Risk Factors: Smoking Status: never (09/23/2009)  Social History: Patient never smoked.  No ETOH use Works at [employer], has done outdoor camps, afterschool care.  Lives w her Mom declines in flu shot 03-06-10  Review of Systems      See HPI  Vital Signs:  Patient profile:   25 year old female Height:  65 inches Weight:      196.38 pounds BMI:     32.80 O2 Sat:      98 % on Room air Temp:     97.2 degrees F oral Pulse rate:   84 / minute BP sitting:   122 / 80  (left arm) Cuff size:   regular  Vitals Entered By: Boone Master CNA/MA (March 06, 2010 10:53 AM)  O2 Flow:  Room air  CC: intermittent burning in chest x2days, and states noticed tachycardia w/ rest x1day Is Patient Diabetic? No Comments  Medications reviewed with patient Daytime contact number verified with patient. Boone Master CNA/MA  March 06, 2010 10:51 AM    Physical Exam  Additional Exam:  GEN: A/Ox3; pleasant , NAD HEENT:  [place]/AT, , EACs-clear, TMs-wnl, NOSE-clear, THROAT-clear NECK:  Supple w/ fair ROM; no JVD; normal carotid impulses w/o bruits; no thyromegaly or nodules palpated; no lymphadenopathy. RESP  Clear to P & A; w/o, wheezes/ rales/ or rhonchi. CARD:  RRR, no m/r/g   GI:   Soft & nt; nml bowel sounds; no organomegaly or masses detected. [place]: Warm bil,  no calf tenderness edema, clubbing, pulses intact Neuro: intact w/ no focal deficits noted.  EKG w/ no acute changes    Impression & Recommendations:  Problem # 1:  ASTHMA (ICD-493.90)  Mild flare with suspected GERD flare  EKG with ;no acute changes.  XRay no acute changes  Plan:  Increase QVAR 2 puffs once daily  Mylanta as needed heartburn.  Increase Prilosec 20mg  two times a day  GERD diet  Avoid eating 3-4 hr prior to bedtime.  follow up Dr. Delton Coombes in 3 weeks  Please contact office for sooner follow up if symptoms do not improve or worsen   Orders: T-2 View CXR (71020TC) Est. Patient Level IV (32440)  Problem # 2:  G E R D (ICD-530.81)  Increase Prilosec 20mg  two times a day  GERD diet  Avoid eating 3-4 hr prior to bedtime.  follow up Dr. Delton Coombes in 3 weeks  Please contact office for sooner follow up if symptoms do not improve or worsen  Her updated medication list for this problem includes:    Omeprazole 20 Mg Cpdr (Omeprazole) .Marland Kitchen... 1 by mouth two times a day  Orders: Est. Patient Level IV (10272)  Problem # 3:  CHEST PAIN, ATYPICAL (ICD-786.59)  ?GERD related xray nml  ekg nml  Plan;   Mylanta as needed heartburn.  Increase Prilosec 20mg  two times a day  GERD diet  Avoid eating 3-4 hr prior to bedtime.  follow up Dr. Delton Coombes in 3 weeks  Please contact office for sooner follow up if symptoms do not improve or  worsen   Orders: Est. Patient Level IV (53664)  Medications Added to Medication List This Visit: 1)  Omeprazole 20 Mg Cpdr (Omeprazole) .Marland Kitchen.. 1 by mouth two times a day 2)  Qvar 80 Mcg/act Aers (Beclomethasone dipropionate) .... Inhale 2  puff once each morning  Patient Instructions: 1)  Increase QVAR 2 puffs once daily  2)  Mylanta as needed heartburn.  3)  Increase Prilosec 20mg  two times a day  4)  GERD diet  5)  Avoid eating 3-4 hr prior to bedtime.  6)  follow up Dr. Delton Coombes in 3 weeks  7)  Please contact office for sooner follow up if symptoms do not improve or worsen

## 2010-03-17 ENCOUNTER — Telehealth (INDEPENDENT_AMBULATORY_CARE_PROVIDER_SITE_OTHER): Payer: Self-pay | Admitting: *Deleted

## 2010-03-19 NOTE — Progress Notes (Signed)
Summary: GI pain?  Phone Note Call from Patient Call back at Home Phone [phone]   Caller: Patient Call For: parrett Summary of Call: pt was seen 2/9. she wants to know if tp thinks pt may have gallstones. pt states that pain is moving from "upper R side to middle and R side of her back and then to her belly". no N or V and no fever per pt.  Initial call taken by: Tivis Ringer, CNA,  March 10, 2010 8:59 AM  Follow-up for Phone Call        Pt c/o right stabbing chest pain radiating through to right back on Sat while sitting in a "[employer]" meeting. Right chest pain radiating down side into abdomen last night waking her. Pt seen by TP on Thursday. She stated she did not go to the ER because the pain stopped but is calling today because her mother insisted. Pt has been taking medication as prescribed. Mylanta did cause her to have an upset stomach. Please advise. Thanks. Zackery Barefoot CMA  March 10, 2010 11:15 AM   Additional Follow-up for Phone Call Additional follow up Details #1::        yes it could be gallbladder  she will need to see her PCP for this  It does not seem to be her breathing/pulm issue.  I will be glad to see her back if needed  Please contact office for sooner follow up if symptoms do not improve or worsen  Additional Follow-up by: Rubye Oaks NP,  March 10, 2010 11:22 AM    Additional Follow-up for Phone Call Additional follow up Details #2::    pt advised to call PCP.Carron Curie CMA  March 10, 2010 12:06 PM

## 2010-03-25 NOTE — Progress Notes (Signed)
Summary: stopped QVAR  Phone Note Call from Patient Call back at Home Phone [phone]   Caller: Patient Call For: byrum Summary of Call: pt says that her chest pains have stopped since she stopped using QVAR. wants to know what (if any) inhaler dr byrum recs? [facility] [place] Initial call taken by: Tivis Ringer, CNA,  March 17, 2010 1:15 PM  Follow-up for Phone Call        Spoke with pt.  She states that since last seen, she stopped the qvar and the pain that she was having in her chest when saw TP has completely resolved.  She stopped taking med 1 wk ago and pain has still not returned.  Wants to know what other inhaler RB might want to change her to.  Pls advise thanks! Follow-up by: Vernie Murders,  March 17, 2010 3:40 PM  Additional Follow-up for Phone Call Additional follow up Details #1::        We can try an alternative inhaled steroid, or we can go off scheduled therapy and just use albuterol as needed. Try using alb as needed for now and schedule OV with me to discuss how frequently she is needing it, whether we need to try another scheduled med. Leslye Peer MD  March 18, 2010 11:03 AM  Additional Follow-up by: Leslye Peer MD,  March 18, 2010 11:03 AM    Additional Follow-up for Phone Call Additional follow up Details #2::    Pt given Dr Kavin Leech recommendations regarding staying off of Qvar and just using Albuterol as needed .  Pt given appt with Dr Delton Coombes on 04-02-10.  Pt to keep log of how often she is uing Albuterol. Abigail Miyamoto RN  March 18, 2010 11:12 AM

## 2010-04-02 ENCOUNTER — Ambulatory Visit: Payer: Self-pay | Admitting: Emergency Medicine

## 2010-04-04 ENCOUNTER — Encounter: Payer: Self-pay | Admitting: Emergency Medicine

## 2010-04-04 ENCOUNTER — Ambulatory Visit (INDEPENDENT_AMBULATORY_CARE_PROVIDER_SITE_OTHER): Payer: Self-pay | Admitting: Emergency Medicine

## 2010-04-04 DIAGNOSIS — J45909 Unspecified asthma, uncomplicated: Secondary | ICD-10-CM

## 2010-04-04 DIAGNOSIS — K219 Gastro-esophageal reflux disease without esophagitis: Secondary | ICD-10-CM

## 2010-04-07 ENCOUNTER — Ambulatory Visit: Payer: Self-pay | Admitting: Emergency Medicine

## 2010-04-08 NOTE — Assessment & Plan Note (Signed)
Summary: asthma, GERD   Visit Type:  Follow-up Copy to:  David Stall  CC:  Asthma.  Pt states chest pain is better since stopping Qvar.  Asthma flares with changes in temperatures outside.Marland Kitchen  History of Present Illness: 25 yo woman, never smoker, dx with asthma as a child. She had to use SABA as a teen when she played softball. Mild persistent asthma.   ROV 10/28/09 -- Returns for asthma. Last time we planned to pre-treat exercise and sports. Developed some URI symptoms about 3 weeks ago, no real fever nasal drainage etc.  Can wake her from sleep and respondes to SABA. Had one episode where she had trouble playing softball even after pre-treating.   ROV 12/05/09 -- f/u for her asthma. Last time we started QVAR 80. She has been taking in morning, not at night because she was having chest pain, burning, ? GERD. Using QVAR in the am has decreased her SABA use. She feels better on it.   March 06, 2010 --Presents for an acute office visit. Complains of intermittent burning in chest x 2 days, and states noticed tachycardia w/ rest x1day. Had severe episode of GERD w/suspected aspiration pneumonitis w/ admission in 08/2009. She says she has on/off burning in chest for last 4 months. Taking Prilosec once daily .Has had more intermittent wheezing and used her proair 3 x this past week. This am ate  sausage biscuit this morning with increased burning in chest. Denies orthopnea, hemoptysis, fever, n/v/d, edema, headache, calf pain, syncope , abdominal pain or bloody symptoms.   ROV 04/04/10 -- excercise induced asthma, has had some persistant symptoms so we started QVAR. She had chest discomfort with the QVAR, increased prilosec but it didn't help. Sounds like the QVAR is causing GERD. No evidence thrush. Stopped QVAR 2 weeks ago and pain resolved. Has been averaging albuterol about two times a day.   Preventive Screening-Counseling & Management  Alcohol-Tobacco     Alcohol drinks/day: 0     Smoking Status:  never  Current Medications (verified): 1)  Proair Hfa 108 (90 Base) Mcg/act Aers (Albuterol Sulfate) .... 2 Puffs Every 4 Hours As Needed 2)  Omeprazole 20 Mg Cpdr (Omeprazole) .Marland Kitchen.. 1 By Mouth Two Times A Day As Needed  Allergies (verified): 1)  ! Rocephin  Vital Signs:  Patient profile:   25 year old female Height:      65 inches (165.10 cm) Weight:      202.13 pounds (91.88 kg) BMI:     33.76 O2 Sat:      98 % on Room air Temp:     97.8 degrees F (36.56 degrees C) oral Pulse rate:   114 / minute BP sitting:   118 / 72  (left arm) Cuff size:   regular  Vitals Entered By: Michel Bickers CMA (April 04, 2010 1:37 PM)  O2 Sat at Rest %:  98 O2 Flow:  Room air CC: Asthma.  Pt states chest pain is better since stopping Qvar.  Asthma flares with changes in temperatures outside. Comments Medications reviewed with patient Michel Bickers CMA  April 04, 2010 1:44 PM   Physical Exam  General:  normal appearance and healthy appearing.   Head:  normocephalic and atraumatic Eyes:  conjunctiva and sclera clear Nose:  no deformity, discharge, inflammation, or lesions Mouth:  no deformity or lesions Neck:  no masses, thyromegaly, or abnormal cervical nodes Lungs:  clear bilaterally to auscultation and percussion Heart:  regular rate and  rhythm, S1, S2 without murmurs, rubs, gallops, or clicks Abdomen:  not examined Msk:  no deformity or scoliosis noted with normal posture Extremities:  no clubbing, cyanosis, edema, or deformity noted Neurologic:  non-focal Skin:  intact without lesions or rashes Psych:  alert and cooperative; normal mood and affect; normal attention span and concentration   Impression & Recommendations:  Problem # 1:  ASTHMA (ICD-493.90) - start Symbicort 80/4.5 two times a day - as needed albuterol - call if CP returns - f/u 1 month  Medications Added to Medication List This Visit: 1)  Omeprazole 20 Mg Cpdr (Omeprazole) .Marland Kitchen.. 1 by mouth two times a day as needed 2)   Symbicort 80-4.5 Mcg/act Aero (Budesonide-formoterol fumarate) .... 2 puffs two times a day  Other Orders: Est. Patient Level IV (16109)  Patient Instructions: 1)  Start Symbicort 80/4.41mcg, 2 puffs two times a day  2)  Use your albuterol as needed  3)  Follow up with Dr Delton Coombes in 1 month 4)  Call our office if your pain returns on the new inhaler.  Prescriptions: SYMBICORT 80-4.5 MCG/ACT AERO (BUDESONIDE-FORMOTEROL FUMARATE) 2 puffs two times a day  #1 x 5   Entered and Authorized by:   Leslye Peer MD   Signed by:   Leslye Peer MD on 04/04/2010   Method used:   Electronically to        [facility] [phone][facility] (retail)       [address]       [place], [place]  [address]       Ph: [phone] or [phone]       Fax: [phone]   RxID:   [id number]

## 2010-04-11 LAB — BASIC METABOLIC PANEL
BUN: 12 mg/dL (ref 6–23)
BUN: 6 mg/dL (ref 6–23)
CO2: 23 mEq/L (ref 19–32)
Calcium: 8.3 mg/dL — ABNORMAL LOW (ref 8.4–10.5)
Calcium: 8.5 mg/dL (ref 8.4–10.5)
Calcium: 8.5 mg/dL (ref 8.4–10.5)
Chloride: 107 mEq/L (ref 96–112)
Chloride: 111 mEq/L (ref 96–112)
Creatinine, Ser: 0.76 mg/dL (ref 0.4–1.2)
Creatinine, Ser: 0.82 mg/dL (ref 0.4–1.2)
GFR calc non Af Amer: >60 mL/min (ref >60)
Glucose, Bld: 131 mg/dL — ABNORMAL HIGH (ref 70–99)
Glucose, Bld: 144 mg/dL — ABNORMAL HIGH (ref 70–99)
Potassium: 4 mEq/L (ref 3.5–5.1)
Sodium: 137 mEq/L (ref 135–145)
Sodium: 139 mEq/L (ref 135–145)

## 2010-04-11 LAB — CULTURE, BLOOD (ROUTINE X 2)

## 2010-04-11 LAB — GLUCOSE, CAPILLARY
Glucose-Capillary: 121 mg/dL — ABNORMAL HIGH (ref 70–99)
Glucose-Capillary: 125 mg/dL — ABNORMAL HIGH (ref 70–99)
Glucose-Capillary: 137 mg/dL — ABNORMAL HIGH (ref 70–99)
Glucose-Capillary: 139 mg/dL — ABNORMAL HIGH (ref 70–99)

## 2010-04-11 LAB — CBC
HCT: 38.5 % (ref 36.0–46.0)
HCT: 39.2 % (ref 36.0–46.0)
Hemoglobin: 13.4 g/dL (ref 12.0–15.0)
MCH: 29.4 pg (ref 26.0–34.0)
MCV: 85.2 fL (ref 78.0–100.0)
MCV: 86.7 fL (ref 78.0–100.0)
MCV: 87.3 fL (ref 78.0–100.0)
Platelets: 210 10*3/uL (ref 150–400)
Platelets: 215 10*3/uL (ref 150–400)
RBC: 4.24 MIL/uL (ref 3.87–5.11)
RDW: 13.1 % (ref 11.5–15.5)
RDW: 13.2 % (ref 11.5–15.5)
WBC: 12.7 10*3/uL — ABNORMAL HIGH (ref 4.0–10.5)
WBC: 18 10*3/uL — ABNORMAL HIGH (ref 4.0–10.5)

## 2010-04-11 LAB — URINALYSIS, ROUTINE W REFLEX MICROSCOPIC
Bilirubin Urine: NEGATIVE
Glucose, UA: NEGATIVE mg/dL
Ketones, ur: NEGATIVE mg/dL
Protein, ur: NEGATIVE mg/dL

## 2010-04-11 LAB — PROTIME-INR: Prothrombin Time: 14.6 seconds (ref 11.6–15.2)

## 2010-04-11 LAB — URINE CULTURE
Culture  Setup Time: 201108060341
Culture: NO GROWTH

## 2010-04-11 LAB — PHOSPHORUS: Phosphorus: 3.3 mg/dL (ref 2.3–4.6)

## 2010-04-11 LAB — CARDIAC PANEL(CRET KIN+CKTOT+MB+TROPI)
CK, MB: 7.6 ng/mL (ref 0.3–4.0)
Relative Index: INVALID (ref 0.0–2.5)
Total CK: 95 U/L (ref 7–177)
Troponin I: 0.02 ng/mL (ref 0.00–0.06)

## 2010-04-11 LAB — URINE MICROSCOPIC-ADD ON

## 2010-04-11 LAB — GASTRIN: Gastrin: 82 pg/mL (ref <101)

## 2010-04-11 LAB — LEGIONELLA ANTIGEN, URINE: Legionella Antigen, Urine: NEGATIVE

## 2010-04-11 LAB — MRSA PCR SCREENING: MRSA by PCR: NEGATIVE

## 2010-04-11 LAB — MAGNESIUM: Magnesium: 2.2 mg/dL (ref 1.5–2.5)

## 2010-04-11 LAB — LACTIC ACID, PLASMA: Lactic Acid, Venous: 1.8 mmol/L (ref 0.5–2.2)

## 2010-04-11 LAB — BRAIN NATRIURETIC PEPTIDE: Pro B Natriuretic peptide (BNP): 191 pg/mL — ABNORMAL HIGH (ref 0.0–100.0)

## 2010-04-14 ENCOUNTER — Telehealth: Payer: Self-pay | Admitting: Emergency Medicine

## 2010-04-16 ENCOUNTER — Telehealth: Payer: Self-pay | Admitting: *Deleted

## 2010-04-16 NOTE — Telephone Encounter (Signed)
Called, spoke with pt.  She was returning call from yesterday in EMR:  Phone Note  Call from Patient Call back at [facility] Phone [phone]   Caller: Patient Call For: byrum Reason for Call: Talk to Nurse Summary of Call: Patient has a question about symbicort usage.   Initial call taken by: Lehman Prom,  April 14, 2010 8:54 AM  Follow-up for Phone Call         called and spoke with pt and she is starting with her softball games again---she stated that she has not used her proair in the last 2 wks---but she had ball practice last night and didnt use the inhaler and she stated that she had some chest tightness---pts question is should she use the proair prior to any softball activities like she did last year?  please advise. thanks Randell Loop CMA  April 14, 2010 10:41 AM   I think she should continue to pre-treat softball games with the ProAir, especially if she had trouble at prior game. Leslye Peer MD  April 14, 2010 6:13 PM  Follow-up by: Leslye Peer MD,  April 14, 2010 6:13 PM  Additional Follow-up for Phone Call  Additional follow up Details #1::        called and lmomtcb if any concerns about the rx for the proair----pt is to pre-treat as directed by RB prior to her softball games to prevent any trouble Randell Loop CMA  April 15, 2010 6:03 PM        Signed by Randell Loop CMA on 04/15/2010 at 6:03 PM   Pt was informed she should continue to pre-treat with proair prior to softball games as she previously had done.  She verbalized understanding and will call office back if she has any problems.

## 2010-04-22 ENCOUNTER — Telehealth: Payer: Self-pay | Admitting: Emergency Medicine

## 2010-04-22 NOTE — Telephone Encounter (Signed)
Pt started on Symbicort 80/4.5 at 04/04/2010 OV with Dr. Delton Coombes. 2 samples of Symbicort 80/4.5 mcg inhalers left for pt to pick up and discount card also left for the patient to take with her to the pharmacy. Samples and discount card placed up front for pt to pick up. LMOMTCB x1.

## 2010-04-22 NOTE — Telephone Encounter (Signed)
Pt's mom Craig Wisnewski returning call can be reached at [phone].

## 2010-04-22 NOTE — Telephone Encounter (Signed)
Called and spoke with pt and advised her 2 samples left upfront with a discount card for pt to take to pharmacy. Pt verbalized understanding and states she will come by and pick them up today  [name], CMA

## 2010-04-24 NOTE — Progress Notes (Signed)
Summary: ? symbicort  Phone Note Call from Patient Call back at Home Phone [phone]   Caller: Patient Call For: Gunner Iodice Reason for Call: Talk to Nurse Summary of Call: Patient has a question about symbicort usage.   Initial call taken by: Lehman Prom,  April 14, 2010 8:54 AM  Follow-up for Phone Call        called and spoke with pt and she is starting with her softball games again---she stated that she has not used her proair in the last 2 wks---but she had ball practice last night and didnt use the inhaler and she stated that she had some chest tightness---pts question is should she use the proair prior to any softball activities like she did last year?  please advise. thanks Randell Loop CMA  April 14, 2010 10:41 AM   I think she should continue to pre-treat softball games with the ProAir, especially if she had trouble at prior game. Leslye Peer MD  April 14, 2010 6:13 PM  Follow-up by: Leslye Peer MD,  April 14, 2010 6:13 PM  Additional Follow-up for Phone Call Additional follow up Details #1::        called and lmomtcb if any concerns about the rx for the proair----pt is to pre-treat as directed by RB prior to her softball games to prevent any trouble Randell Loop CMA  April 15, 2010 6:03 PM

## 2010-05-08 ENCOUNTER — Encounter: Payer: Self-pay | Admitting: Emergency Medicine

## 2010-05-09 ENCOUNTER — Encounter: Payer: Self-pay | Admitting: Emergency Medicine

## 2010-05-09 ENCOUNTER — Ambulatory Visit (INDEPENDENT_AMBULATORY_CARE_PROVIDER_SITE_OTHER): Payer: Self-pay | Admitting: Emergency Medicine

## 2010-05-09 DIAGNOSIS — J45909 Unspecified asthma, uncomplicated: Secondary | ICD-10-CM

## 2010-05-09 DIAGNOSIS — K219 Gastro-esophageal reflux disease without esophagitis: Secondary | ICD-10-CM

## 2010-05-09 DIAGNOSIS — J302 Other seasonal allergic rhinitis: Secondary | ICD-10-CM | POA: Insufficient documentation

## 2010-05-09 DIAGNOSIS — J309 Allergic rhinitis, unspecified: Secondary | ICD-10-CM

## 2010-05-09 MED ORDER — LORATADINE 10 MG PO TABS: 10.0000 mg | ORAL_TABLET | Freq: Every day | ORAL | Status: DC

## 2010-05-09 NOTE — Assessment & Plan Note (Signed)
-   stay on symbicort bid - SABA prn  - rx allergies and GERD

## 2010-05-09 NOTE — Patient Instructions (Signed)
Continue your Symbicort twice a day Use your rescue inhaler as needed and 20 minutes before exercise Continue your Prilosec daily Start loratadine 10mg  daily Follow up with Dr Delton Coombes in 6 months or sooner if needed

## 2010-05-09 NOTE — Assessment & Plan Note (Signed)
Omeprazole to qd

## 2010-05-09 NOTE — Assessment & Plan Note (Signed)
- 

## 2010-05-09 NOTE — Progress Notes (Signed)
  Subjective:    Patient ID: Jaclyn Mitchell, female    DOB: 06-Jun-1985, 25 y.o.   MRN: [id number]  HPI 25 yo woman, never smoker, dx with asthma as a child. She had to use SABA as a teen when she played softball. Mild persistent asthma.   ROV 10/28/09 -- Returns for asthma. Last time we planned to pre-treat exercise and sports. Developed some URI symptoms about 3 weeks ago, no real fever nasal drainage etc.  Can wake her from sleep and respondes to SABA. Had one episode where she had trouble playing softball even after pre-treating.   ROV 12/05/09 -- f/u for her asthma. Last time we started QVAR 80. She has been taking in morning, not at night because she was having chest pain, burning, ? GERD. Using QVAR in the am has decreased her SABA use. She feels better on it.   March 06, 2010 --Presents for an acute office visit. Complains of intermittent burning in chest x 2 days, and states noticed tachycardia w/ rest x1day. Had severe episode of GERD w/suspected aspiration pneumonitis w/ admission in 08/2009. She says she has on/off burning in chest for last 4 months. Taking Prilosec once daily .Has had more intermittent wheezing and used her proair 3 x this past week. This am ate  sausage biscuit this morning with increased burning in chest. Denies orthopnea, hemoptysis, fever, n/v/d, edema, headache, calf pain, syncope , abdominal pain or bloody symptoms.   ROV 04/04/10 -- excercise induced asthma, has had some persistant symptoms so we started QVAR. She had chest discomfort with the QVAR, increased prilosec but it didn't help. Sounds like the QVAR is causing GERD. No evidence thrush. Stopped QVAR 2 weeks ago and pain resolved. Has been averaging albuterol about two times a day.   ROV 05/09/10 -- asthma, triggers include exercise. Last time we changed to Symbicort as she was having chest pain with QVAR. ? GERD sx, taking omeprazole qd. Change in the weather bothers her, on a few occasions she has had to use  SABA. Having some trouble w allergies since last visit. The GERD seems to be better with Symbicort.    Review of Systems As per HPI    Objective:   Physical Exam Gen: Pleasant, well-nourished, in no distress,  normal affect  ENT: No lesions,  mouth clear,  oropharynx clear, no postnasal drip  Lungs: No use of accessory muscles, no dullness to percussion, clear without rales or rhonchi  Cardiovascular: RRR, heart sounds normal, no murmur or gallops, no peripheral edema  Musculoskeletal: No deformities, no cyanosis or clubbing  Neuro: alert, non focal  Skin: Warm, no lesions or rashes        Assessment & Plan:  ASTHMA - stay on symbicort bid - SABA prn  - rx allergies and GERD  G E R D Omeprazole to qd  Allergic rhinitis, seasonal Start loratadine qd

## 2010-05-26 ENCOUNTER — Telehealth: Payer: Self-pay | Admitting: Emergency Medicine

## 2010-05-26 NOTE — Telephone Encounter (Signed)
lmomtcb x1 

## 2010-05-26 NOTE — Telephone Encounter (Signed)
She has always tolerated the albuterol before - I don';t think it was the med. She should continue to use as we've discussed, notify me if she has another episode like this. We may need to adjust her medications

## 2010-05-26 NOTE — Telephone Encounter (Signed)
Spoke w/ pt and advised her of RB recs. Pt verbalized understanding and states she will call back if she has another episode like this. Nothing further was needed

## 2010-05-26 NOTE — Telephone Encounter (Signed)
Patient phoned stated that she was returning a call to triage. She can be reached at [phone].Jaclyn Mitchell

## 2010-05-26 NOTE — Telephone Encounter (Signed)
Spoke w/ pt and she states Dr. Delton Coombes advised her it was okay for her to play softball. Pt states she took her albuterol inhaler 25 minutes before her game and states her heart was racing, had a hard time breathing (doesn't think it was asthma related). Pt states once she had drunk something cold she was fine. Pt states then on Sunday she started having chest pains and she took an antacid but didn't really have any relief and was like that all day on Sunday. Pt wants to know what could have caused her to feel like this and if Dr. Delton Coombes has an suggestions on what to do next time this happens. Pt is coming in 5/21 for follow up. Please advise Dr. Delton Coombes. Thanks  [name], CMA

## 2010-06-02 ENCOUNTER — Telehealth: Payer: Self-pay | Admitting: Emergency Medicine

## 2010-06-02 NOTE — Telephone Encounter (Signed)
Will forward to Dr. Delton Coombes as an Lorain Childes

## 2010-06-16 ENCOUNTER — Ambulatory Visit (INDEPENDENT_AMBULATORY_CARE_PROVIDER_SITE_OTHER): Payer: Self-pay | Admitting: Emergency Medicine

## 2010-06-16 ENCOUNTER — Encounter: Payer: Self-pay | Admitting: Emergency Medicine

## 2010-06-16 VITALS — BP 114/70 | HR 77 | Temp 97.8°F | Ht 60.0 in | Wt 199.6 lb

## 2010-06-16 DIAGNOSIS — J45909 Unspecified asthma, uncomplicated: Secondary | ICD-10-CM

## 2010-06-16 NOTE — Progress Notes (Signed)
  Subjective:    Patient ID: Jaclyn Mitchell, female    DOB: November 08, 1985, 25 y.o.   MRN: [id number]  HPI 25 yo woman, never smoker, dx with asthma as a child. She had to use SABA as a teen when she played softball. Mild persistent asthma.   ROV 10/28/09 -- Returns for asthma. Last time we planned to pre-treat exercise and sports. Developed some URI symptoms about 3 weeks ago, no real fever nasal drainage etc.  Can wake her from sleep and respondes to SABA. Had one episode where she had trouble playing softball even after pre-treating.   ROV 12/05/09 -- f/u for her asthma. Last time we started QVAR 80. She has been taking in morning, not at night because she was having chest pain, burning, ? GERD. Using QVAR in the am has decreased her SABA use. She feels better on it.   March 06, 2010 --Presents for an acute office visit. Complains of intermittent burning in chest x 2 days, and states noticed tachycardia w/ rest x1day. Had severe episode of GERD w/suspected aspiration pneumonitis w/ admission in 08/2009. She says she has on/off burning in chest for last 4 months. Taking Prilosec once daily .Has had more intermittent wheezing and used her proair 3 x this past week. This am ate  sausage biscuit this morning with increased burning in chest. Denies orthopnea, hemoptysis, fever, n/v/d, edema, headache, calf pain, syncope , abdominal pain or bloody symptoms.   ROV 04/04/10 -- excercise induced asthma, has had some persistant symptoms so we started QVAR. She had chest discomfort with the QVAR, increased prilosec but it didn't help. Sounds like the QVAR is causing GERD. No evidence thrush. Stopped QVAR 2 weeks ago and pain resolved. Has been averaging albuterol about two times a day.   ROV 05/09/10 -- asthma, triggers include exercise. Last time we changed to Symbicort as she was having chest pain with QVAR. ? GERD sx, taking omeprazole qd. Change in the weather bothers her, on a few occasions she has had to use  SABA. Having some trouble w allergies since last visit. The GERD seems to be better with Symbicort.   ROV 06/16/10 -- hx asthma, mild persistent with exercise as a trigger, also allergic rhinitis. She returns today for f/u. Since we changed QVAR to Symbicort she has done very well. She has not been needing her SABA, in fact did not take it before a softball game recently and she did well - no breathing problems. She has not been taking her omeprazole regularly.    Review of Systems As per HPI    Objective:   Physical Exam Gen: Pleasant, well-nourished, in no distress,  normal affect  ENT: No lesions,  mouth clear,  oropharynx clear, no postnasal drip  Lungs: No use of accessory muscles, no dullness to percussion, clear without rales or rhonchi  Cardiovascular: RRR, heart sounds normal, no murmur or gallops, no peripheral edema  Musculoskeletal: No deformities, no cyanosis or clubbing  Neuro: alert, non focal  Skin: Warm, no lesions or rashes

## 2010-06-16 NOTE — Assessment & Plan Note (Signed)
Not currently on any allergy regimen Taking omeprazole but not regularly Continue Symbicort bid, SABA prn. We can consider scaling back to QVAR at some point in the future.  ROV 6 months or prn

## 2010-06-16 NOTE — Patient Instructions (Signed)
We will continue your Symbicort twice a day Use ProAir as needed Take your omeprazole every day  Follow up with Dr Delton Coombes in 6 months or as needed

## 2010-06-18 ENCOUNTER — Telehealth: Payer: Self-pay | Admitting: Emergency Medicine

## 2010-06-18 NOTE — Telephone Encounter (Signed)
This has been a recurrent problem, initially seemed to be related to QVAR. I think this is GERD, agree with antacid. She would likely benefit from PPI if not already taking (omeprazole, or Nexium or Protonix, etc. )

## 2010-06-18 NOTE — Telephone Encounter (Signed)
[id number].Jennifer Castillo, CMA  

## 2010-06-18 NOTE — Telephone Encounter (Signed)
Spoke with pt and she is c/o waking up this AM with chest pain. She describes the pain as a burning sensation in the center of her chest and it radiates to her right side. She states the pain is constant and nothing has relieved it.  She has taken her antacid without relief.  SHe denies SOB, N/V, fever, wheezing. She has not used her albuterol inhaler in about a month so I advised her to use her proair to see if this relieves the sensation and I will send Dr. Delton Coombes a message in the meantime to get his recs.Carron Curie, CMA

## 2010-06-18 NOTE — Telephone Encounter (Signed)
lmomtcb x1 

## 2010-06-19 NOTE — Telephone Encounter (Signed)
Spoke with the patient and advised of recs. I also advised the pt to use her proair to see if this helps the symptoms. I advised if she does nto see any improvement in a couple days to call for an appt.Carron Curie, CMA

## 2010-06-24 ENCOUNTER — Telehealth: Payer: Self-pay | Admitting: Emergency Medicine

## 2010-06-24 NOTE — Telephone Encounter (Signed)
Spoke with the patient and she states that she is again having chest pain and now a cough as well, so I advised the pt come in AM to see byrum. Pt set to see RB tomorrow at 9:30am.Jennifer Yancey Flemings, CMA

## 2010-06-25 ENCOUNTER — Ambulatory Visit (INDEPENDENT_AMBULATORY_CARE_PROVIDER_SITE_OTHER): Payer: Self-pay | Admitting: Emergency Medicine

## 2010-06-25 ENCOUNTER — Encounter: Payer: Self-pay | Admitting: Emergency Medicine

## 2010-06-25 VITALS — BP 110/70 | HR 94 | Temp 97.9°F | Ht 64.0 in | Wt 202.2 lb

## 2010-06-25 DIAGNOSIS — J45909 Unspecified asthma, uncomplicated: Secondary | ICD-10-CM

## 2010-06-25 NOTE — Assessment & Plan Note (Signed)
Coughing but does not seem to have an asthma flare at this time, suspect UA irritation from GERD, possibly contribution of URI and Symbicort - temporarily hold symbicort (2 days)  - omeprazole bid x 5 days then back to qd  - SABA prn - call if cough persists.

## 2010-06-25 NOTE — Patient Instructions (Signed)
Stop Symbicort until Saturday morning Take the omeprazole twice a day for the next 5 days, then go back to daily Use your rescue inhaler as needed.  Call our office if your cough isn't better next week.

## 2010-06-25 NOTE — Progress Notes (Signed)
  Subjective:    Patient ID: Jaclyn Mitchell, female    DOB: 04/14/1985, 25 y.o.   MRN: [id number]  HPI 25 yo woman, never smoker, dx with asthma as a child. She had to use SABA as a teen when she played softball. Mild persistent asthma.   ROV 10/28/09 -- Returns for asthma. Last time we planned to pre-treat exercise and sports. Developed some URI symptoms about 3 weeks ago, no real fever nasal drainage etc.  Can wake her from sleep and respondes to SABA. Had one episode where she had trouble playing softball even after pre-treating.   ROV 12/05/09 -- f/u for her asthma. Last time we started QVAR 80. She has been taking in morning, not at night because she was having chest pain, burning, ? GERD. Using QVAR in the am has decreased her SABA use. She feels better on it.   March 06, 2010 --Presents for an acute office visit. Complains of intermittent burning in chest x 2 days, and states noticed tachycardia w/ rest x1day. Had severe episode of GERD w/suspected aspiration pneumonitis w/ admission in 08/2009. She says she has on/off burning in chest for last 4 months. Taking Prilosec once daily .Has had more intermittent wheezing and used her proair 3 x this past week. This am ate  sausage biscuit this morning with increased burning in chest. Denies orthopnea, hemoptysis, fever, n/v/d, edema, headache, calf pain, syncope , abdominal pain or bloody symptoms.   ROV 04/04/10 -- excercise induced asthma, has had some persistant symptoms so we started QVAR. She had chest discomfort with the QVAR, increased prilosec but it didn't help. Sounds like the QVAR is causing GERD. No evidence thrush. Stopped QVAR 2 weeks ago and pain resolved. Has been averaging albuterol about two times a day.   ROV 05/09/10 -- asthma, triggers include exercise. Last time we changed to Symbicort as she was having chest pain with QVAR. ? GERD sx, taking omeprazole qd. Change in the weather bothers her, on a few occasions she has had to use  SABA. Having some trouble w allergies since last visit. The GERD seems to be better with Symbicort.   ROV 06/16/10 -- hx asthma, mild persistent with exercise as a trigger, also allergic rhinitis. She returns today for f/u. Since we changed QVAR to Symbicort she has done very well. She has not been needing her SABA, in fact did not take it before a softball game recently and she did well - no breathing problems. She has not been taking her omeprazole regularly.   ROV 06/25/10 -- asthma, often exercise related. Also with allergies and GERD as triggers. Remains on Symbicort bid. She presents with a week of cough, burning in chest last week x 1, led to cough that hasn't gone away. She upped omeprazole to bid with resolution of the GERD sx. Propped her head up in bed.  No wheezing. Has not used her SABA, hasn't needed it.    Review of Systems As per HPI    Objective:   Physical Exam Gen: Pleasant, well-nourished, in no distress,  normal affect  ENT: No lesions,  mouth clear,  oropharynx clear, no postnasal drip, some hoarse voice.   Lungs: No use of accessory muscles, no dullness to percussion, clear without rales or rhonchi  Cardiovascular: RRR, heart sounds normal, no murmur or gallops, no peripheral edema  Musculoskeletal: No deformities, no cyanosis or clubbing  Neuro: alert, non focal  Skin: Warm, no lesions or rashes

## 2010-06-27 ENCOUNTER — Telehealth: Payer: Self-pay | Admitting: Emergency Medicine

## 2010-06-27 NOTE — Telephone Encounter (Signed)
lmomtcb for pt---per last ov note--pt was to stop the symbicort until Saturday.

## 2010-06-27 NOTE — Telephone Encounter (Signed)
Pt aware she should go with dr byrums recs. i don't think she should go without anything at all.

## 2010-06-30 ENCOUNTER — Telehealth: Payer: Self-pay | Admitting: Emergency Medicine

## 2010-06-30 NOTE — Telephone Encounter (Signed)
Samples left at front. Pt aware. Jennifer Castillo, CMA  

## 2010-07-01 ENCOUNTER — Telehealth: Payer: Self-pay | Admitting: *Deleted

## 2010-07-01 NOTE — Telephone Encounter (Signed)
Dr Delton Coombes, pt came to pick up samples today and wanted to let you know that she is doing well- cough has resolved and decreased need for rescue inhaler. FYI.

## 2010-07-02 NOTE — Telephone Encounter (Signed)
Thank you - suspect she is better with improved control of her GERD. RSB

## 2010-08-12 ENCOUNTER — Telehealth: Payer: Self-pay | Admitting: Emergency Medicine

## 2010-08-12 NOTE — Telephone Encounter (Signed)
Have her increase prilosec Twice daily   Brush/rinse and gargle after use.  Then set up ov with byrum or Leigh Blas for follow up if this does not work.  Use delsym 2 tsp Twice daily  As needed  Cough  Please contact office for sooner follow up if symptoms do not improve or worsen or seek emergency care

## 2010-08-12 NOTE — Telephone Encounter (Signed)
Pt notified of TP recs. She will try this and call if her cough does not improve.

## 2010-08-12 NOTE — Telephone Encounter (Signed)
Pt c/o a dry cough during the day but says it is slightly prod during the night. She is back on Symbicort twice daily and says she is rinsing well after suing. She stopped the Symbicort once before and the cough resolved. She is not taking her loratadine or using her rescue inhaler. She denies any sob or wheezing. Will send msg to TP for recs. RB is out of the office. Pls advise.

## 2010-08-15 ENCOUNTER — Telehealth: Payer: Self-pay | Admitting: Emergency Medicine

## 2010-08-15 MED ORDER — BENZONATATE 100 MG PO CAPS: ORAL_CAPSULE | ORAL | Status: DC

## 2010-08-15 NOTE — Telephone Encounter (Signed)
Rx sent to pharm. Spoke with tp and notified that this was done. She states that she will cont to follow TP's recs and call us on Monday of not improved for appt. I advised that she seek emergency care in the meantime if needed.

## 2010-08-15 NOTE — Telephone Encounter (Signed)
Spoke with patient-states she has tried the recs form TP on 08-12-10; RB gone for the evening please advise. Pt sounds hoarse over the phone and deep cough as well.   PARRETT,TAMMY, NP 08/12/2010 10:29 AM Signed  Have her increase prilosec Twice daily  Brush/rinse and gargle after use.  Then set up ov with byrum or parrett for follow up if this does not work.  Use delsym 2 tsp Twice daily As needed Cough  Please contact office for sooner follow up if symptoms do not improve or worsen or seek emergency care

## 2010-08-15 NOTE — Telephone Encounter (Signed)
Pt needs to be called back at [phone]

## 2010-08-15 NOTE — Telephone Encounter (Signed)
Can call in tessalon pearls for cough, but she needs ov as outlined in TP's phone note. Tessalon pearls 100mg  two po q6hrs prn cough.  #30, no fills.

## 2010-08-18 ENCOUNTER — Telehealth: Payer: Self-pay | Admitting: Emergency Medicine

## 2010-08-18 NOTE — Telephone Encounter (Signed)
PT RETURNED CALL FROM TRIAGE. PER JENNIFER, I PUT PT ON TP'S SCHEDULE FOR TOMORROW. Jaclyn Mitchell

## 2010-08-18 NOTE — Telephone Encounter (Signed)
LMTCBx1 to schedule pt to see TP tomorrow. Carron Curie, CMA

## 2010-08-19 ENCOUNTER — Ambulatory Visit: Payer: Self-pay | Admitting: Adult Health

## 2010-08-20 ENCOUNTER — Ambulatory Visit (INDEPENDENT_AMBULATORY_CARE_PROVIDER_SITE_OTHER): Payer: Self-pay | Admitting: Adult Health

## 2010-08-20 ENCOUNTER — Encounter: Payer: Self-pay | Admitting: Adult Health

## 2010-08-20 DIAGNOSIS — R05 Cough: Secondary | ICD-10-CM

## 2010-08-20 MED ORDER — HYDROCODONE-HOMATROPINE 5-1.5 MG/5ML PO SYRP: 5.0000 mL | ORAL_SOLUTION | Freq: Four times a day (QID) | ORAL | Status: AC | PRN

## 2010-08-20 NOTE — Assessment & Plan Note (Addendum)
Cyclical cough with ?associated rhinitis and reflux  No obvious asthma flare   Plan:  Cough could be caused  By multiple problems- reflux, drainage and upper airway irritation  And asthma  IT does not appear your asthma is flaring up .  Stop Symbicort until Saturday morning Take the omeprazole twice a day for the 2 weeks , then go back to daily Add Pepcid 20mg  At bedtime  Until cough is gone, can use it As needed  After this when cough comes back.  Use sugarless candy, water to avoid cough or throat clearing. NO MINTS OR GARLIC  Stop Claritin  Begin Zyrtec 10mg  At bedtime   Use Delsym 2 tsp Twice daily  Until cough is gone.  May use Hydromet 1-2 tsp every 6 hr As needed  Cough-may make you sleepy.  Use your rescue inhaler as needed.  Call our office if your cough isn't better next week.  Please contact office for sooner follow up if symptoms do not improve or worsen or seek emergency care  follow up dr byrum as  Planned and As needed

## 2010-08-20 NOTE — Patient Instructions (Signed)
Cough could be caused  By multiple problems- reflux, drainage and upper airway irritation  And asthma  IT does not appear your asthma is flaring up .  Stop Symbicort until Saturday morning Take the omeprazole twice a day for the 2 weeks , then go back to daily Add Pepcid 20mg  At bedtime  Until cough is gone, can use it As needed  After this when cough comes back.  Use sugarless candy, water to avoid cough or throat clearing. NO MINTS OR GARLIC  Stop Claritin  Begin Zyrtec 10mg  At bedtime   Use Delsym 2 tsp Twice daily  Until cough is gone.  May use Hydromet 1-2 tsp every 6 hr As needed  Cough-may make you sleepy.  Use your rescue inhaler as needed.  Call our office if your cough isn't better next week.  Please contact office for sooner follow up if symptoms do not improve or worsen or seek emergency care  follow up dr byrum as  Planned and As needed

## 2010-08-20 NOTE — Progress Notes (Signed)
Subjective:    Patient ID: Jaclyn Mitchell, female    DOB: 10-29-1985, 25 y.o.   MRN: [id number]  HPI 25 yo woman, never smoker, dx with asthma as a child. She had to use SABA as a teen when she played softball. Mild persistent asthma.   ROV 10/28/09 -- Returns for asthma. Last time we planned to pre-treat exercise and sports. Developed some URI symptoms about 3 weeks ago, no real fever nasal drainage etc.  Can wake her from sleep and respondes to SABA. Had one episode where she had trouble playing softball even after pre-treating.   ROV 12/05/09 -- f/u for her asthma. Last time we started QVAR 80. She has been taking in morning, not at night because she was having chest pain, burning, ? GERD. Using QVAR in the am has decreased her SABA use. She feels better on it.   March 06, 2010 --Presents for an acute office visit. Complains of intermittent burning in chest x 2 days, and states noticed tachycardia w/ rest x1day. Had severe episode of GERD w/suspected aspiration pneumonitis w/ admission in 08/2009. She says she has on/off burning in chest for last 4 months. Taking Prilosec once daily .Has had more intermittent wheezing and used her proair 3 x this past week. This am ate  sausage biscuit this morning with increased burning in chest. Denies orthopnea, hemoptysis, fever, n/v/d, edema, headache, calf pain, syncope , abdominal pain or bloody symptoms.   ROV 04/04/10 -- excercise induced asthma, has had some persistant symptoms so we started QVAR. She had chest discomfort with the QVAR, increased prilosec but it didn't help. Sounds like the QVAR is causing GERD. No evidence thrush. Stopped QVAR 2 weeks ago and pain resolved. Has been averaging albuterol about two times a day.   ROV 05/09/10 -- asthma, triggers include exercise. Last time we changed to Symbicort as she was having chest pain with QVAR. ? GERD sx, taking omeprazole qd. Change in the weather bothers her, on a few occasions she has had to use  SABA. Having some trouble w allergies since last visit. The GERD seems to be better with Symbicort.   ROV 06/16/10 -- hx asthma, mild persistent with exercise as a trigger, also allergic rhinitis. She returns today for f/u. Since we changed QVAR to Symbicort she has done very well. She has not been needing her SABA, in fact did not take it before a softball game recently and she did well - no breathing problems. She has not been taking her omeprazole regularly.   ROV 06/25/10 -- asthma, often exercise related. Also with allergies and GERD as triggers. Remains on Symbicort bid. She presents with a week of cough, burning in chest last week x 1, led to cough that hasn't gone away. She upped omeprazole to bid with resolution of the GERD sx. Propped her head up in bed.  No wheezing. Has not used her SABA, hasn't needed it.   Acute OV 08/20/2010  Pt complains of  Non-productive cough x 1 week. No wheezing or chest tightness. Did well w/ no SABA use for last 3 months.  Cough came back with recent change in weather and worse with high humidity . Working in outside camp.  She complains cough is keeping her up at night. No discolored mucus. NO chest pain. No overt reflux.  Tessalon not helping. Using otc cough meds.      Review of Systems Constitutional:   No  weight loss, night sweats,  Fevers, chills,  fatigue, or  lassitude.  HEENT:   No headaches,  Difficulty swallowing,  Tooth/dental problems, or  Sore throat,                + sneezing, itching,  , nasal congestion, post nasal drip,   CV:  No chest pain,  Orthopnea, PND, swelling in lower extremities, anasarca, dizziness, palpitations, syncope.   GI  No heartburn, indigestion, abdominal pain, nausea, vomiting, diarrhea, change in bowel habits, loss of appetite, bloody stools.   Resp: No shortness of breath with exertion or at rest.  No excess mucus, no productive cough,   No coughing up of blood.  No change in color of mucus.  No wheezing.  No chest  wall deformity  Skin: no rash or lesions.  GU: no dysuria, change in color of urine, no urgency or frequency.  No flank pain, no hematuria   MS:  No joint pain or swelling.  No decreased range of motion.  No back pain.  Psych:  No change in mood or affect. No depression or anxiety.  No memory loss.         Objective:   Physical Exam Gen: Pleasant, well-nourished, in no distress,  normal affect  ENT: No lesions,  mouth clear,  oropharynx clear, no postnasal drip, some hoarse voice.   Lungs: No use of accessory muscles, no dullness to percussion, clear without rales or rhonchi  Cardiovascular: RRR, heart sounds normal, no murmur or gallops, no peripheral edema  Musculoskeletal: No deformities, no cyanosis or clubbing  Neuro: alert, non focal  Skin: Warm, no lesions or rashes  HPI   Review of Systems   Physical Exam

## 2010-09-02 ENCOUNTER — Telehealth: Payer: Self-pay | Admitting: Emergency Medicine

## 2010-09-02 NOTE — Telephone Encounter (Signed)
Made pt aware of TP recs. Pt verbalized understanding. Pt states if she gets worse or is still having these symptoms she will come in to be seen

## 2010-09-02 NOTE — Telephone Encounter (Signed)
Spoke with pt and she states since she had started taking the hydrocodone cough syrup she has been getting dizzy. Pt has stopped taking the cough syrup x 2 days. Pt states when she used her proair inhaler this morning she felt sick on her stomach, felt a little dizzy, and then had a headache. Pt states she has been taking proair over a year and has no felt like this before. Please advise Tammy. Thanks  [name], CMA

## 2010-09-02 NOTE — Telephone Encounter (Signed)
LMOMTCB x 1 

## 2010-09-02 NOTE — Telephone Encounter (Signed)
PT RETURNED CALL. [phone]. Jaclyn Mitchell

## 2010-09-02 NOTE — Telephone Encounter (Signed)
?   Unclear.  Should not be from inhaler after all this time  make sure she is drinking enough fluids Hydrocodone syrup can cause nausea and if she is sick could be drainage causing nausea If persists with resp. Symptoms will be glad to see in office  Please contact office for sooner follow up if symptoms do not improve or worsen or seek emergency care

## 2010-09-02 NOTE — Telephone Encounter (Signed)
Pt returned phone call.  Jaclyn Mitchell ° °

## 2010-09-04 ENCOUNTER — Telehealth: Payer: Self-pay | Admitting: Emergency Medicine

## 2010-09-04 NOTE — Telephone Encounter (Signed)
LMTCBx1 to see if ok for pt to have ventolin. Sample in triage for the pt. Carron Curie, CMA

## 2010-09-04 NOTE — Telephone Encounter (Signed)
Sample of ventolin left at front.Jaclyn Mitchell, CMA

## 2010-09-09 ENCOUNTER — Telehealth: Payer: Self-pay | Admitting: Emergency Medicine

## 2010-09-09 NOTE — Telephone Encounter (Signed)
2 samples left at front. Pt mother aware. Carron Curie, CMA

## 2010-09-15 ENCOUNTER — Telehealth: Payer: Self-pay | Admitting: Emergency Medicine

## 2010-09-15 NOTE — Telephone Encounter (Signed)
LMTCB

## 2010-09-16 NOTE — Telephone Encounter (Signed)
Pt would like be reached at [phone].  Jaclyn Mitchell

## 2010-09-16 NOTE — Telephone Encounter (Signed)
Spoke with the pt and she states her cough is not improved at all since OV on 7-25 with TP. She states she has been taking omeprazole twice a day x 2 weeks, pepcid at bedtime, and zyrtec daily as well. She is also using symbocort but is still coughing.  She states she coughs so much her chest is sore. Pt wants to know if Dr. Delton Coombes has any other recs for her cough? She states she was at work and could not come in for an appt.  Please advise. Carron Curie, CMA

## 2010-09-17 NOTE — Telephone Encounter (Signed)
Pt returned call again  °

## 2010-09-17 NOTE — Telephone Encounter (Signed)
Discussed her cough  - is happening in paroxysms, is waking her from sleep, can also happen during the day. Non-productive. She is on omeprazole bid, loratadine. Has cough syrup w codeine, hasn';t really taken it because it makes her feel dizzy. Difficult to determine whether this is largely GERD +/- allergies.  - temporarily stop Symbicort and use ventolin prn - loratadine - omeprazole bid - start the cough syrup at lower dose - may need to add nasal steroid at some point - she will call next week to let me know how she is doing

## 2010-09-22 ENCOUNTER — Telehealth: Payer: Self-pay | Admitting: Emergency Medicine

## 2010-09-22 NOTE — Telephone Encounter (Signed)
Spoke with pt. She states that her cough is no better since last spoke with you on 09/15/10. She states that she has stopped the symbicort and has been following all of your instructions. Denies any new complaints and states that the cough is no worse than it has been, just no better. Please advise any further recs thanks

## 2010-09-22 NOTE — Telephone Encounter (Signed)
Pt returned call.  Pt can be reached at [phone].  Jaclyn Mitchell

## 2010-09-22 NOTE — Telephone Encounter (Signed)
Spoke with pt and notified of recs per RB. She verbalized understanding. Offered appt with TP for tomorrow and she states can not come in then, offered appt with PW for the following day and she refused that as well, stating will have to check with her mother and will call back to sched appt this wk. RB has nothing open until 10/07/10

## 2010-09-22 NOTE — Telephone Encounter (Signed)
Pt called back. Asked to be called back before 1:45 because she will have to go to work by then . Jaclyn Mitchell

## 2010-09-22 NOTE — Telephone Encounter (Signed)
LMTCBx1.Inice Sanluis, CMA  

## 2010-09-22 NOTE — Telephone Encounter (Signed)
She needs to be seen, I'm not sure what to do with her from here.

## 2010-11-03 ENCOUNTER — Encounter: Payer: Self-pay | Admitting: Emergency Medicine

## 2010-11-03 ENCOUNTER — Ambulatory Visit (INDEPENDENT_AMBULATORY_CARE_PROVIDER_SITE_OTHER): Payer: Self-pay | Admitting: Emergency Medicine

## 2010-11-03 VITALS — BP 110/70 | HR 107 | Temp 98.0°F | Ht 64.0 in | Wt 197.0 lb

## 2010-11-03 DIAGNOSIS — J45909 Unspecified asthma, uncomplicated: Secondary | ICD-10-CM

## 2010-11-03 NOTE — Patient Instructions (Signed)
Continue your Symbicort twice a day Continue your ProAir as needed Continue omeprazole daily You should get the flu shot Try to avoid carbonated drinks and orange juice.  Follow with Dr Delton Coombes in 6 months or sooner if you have any problems.

## 2010-11-03 NOTE — Assessment & Plan Note (Signed)
Better control with decrease in carbonated drinks, GERD control - continue current meds - avoid carbonated drinks - she doesn;t want flu shot today - rov 6 mon or prn

## 2010-11-03 NOTE — Progress Notes (Signed)
Subjective:    Patient ID: Jaclyn Mitchell, female    DOB: 1985-06-11, 25 y.o.   MRN: [id number]  HPI 25 yo woman, never smoker, dx with asthma as a child. She had to use SABA as a teen when she played softball. Mild persistent asthma.   ROV 10/28/09 -- Returns for asthma. Last time we planned to pre-treat exercise and sports. Developed some URI symptoms about 3 weeks ago, no real fever nasal drainage etc.  Can wake her from sleep and respondes to SABA. Had one episode where she had trouble playing softball even after pre-treating.   ROV 12/05/09 -- f/u for her asthma. Last time we started QVAR 80. She has been taking in morning, not at night because she was having chest pain, burning, ? GERD. Using QVAR in the am has decreased her SABA use. She feels better on it.   March 06, 2010 --Presents for an acute office visit. Complains of intermittent burning in chest x 2 days, and states noticed tachycardia w/ rest x1day. Had severe episode of GERD w/suspected aspiration pneumonitis w/ admission in 08/2009. She says she has on/off burning in chest for last 4 months. Taking Prilosec once daily .Has had more intermittent wheezing and used her proair 3 x this past week. This am ate  sausage biscuit this morning with increased burning in chest. Denies orthopnea, hemoptysis, fever, n/v/d, edema, headache, calf pain, syncope , abdominal pain or bloody symptoms.   ROV 04/04/10 -- excercise induced asthma, has had some persistant symptoms so we started QVAR. She had chest discomfort with the QVAR, increased prilosec but it didn't help. Sounds like the QVAR is causing GERD. No evidence thrush. Stopped QVAR 2 weeks ago and pain resolved. Has been averaging albuterol about two times a day.   ROV 05/09/10 -- asthma, triggers include exercise. Last time we changed to Symbicort as she was having chest pain with QVAR. ? GERD sx, taking omeprazole qd. Change in the weather bothers her, on a few occasions she has had to use  SABA. Having some trouble w allergies since last visit. The GERD seems to be better with Symbicort.   ROV 06/16/10 -- hx asthma, mild persistent with exercise as a trigger, also allergic rhinitis. She returns today for f/u. Since we changed QVAR to Symbicort she has done very well. She has not been needing her SABA, in fact did not take it before a softball game recently and she did well - no breathing problems. She has not been taking her omeprazole regularly.   ROV 06/25/10 -- asthma, often exercise related. Also with allergies and GERD as triggers. Remains on Symbicort bid. She presents with a week of cough, burning in chest last week x 1, led to cough that hasn't gone away. She upped omeprazole to bid with resolution of the GERD sx. Propped her head up in bed.  No wheezing. Has not used her SABA, hasn't needed it.   Acute OV 08/20/2010  Pt complains of  Non-productive cough x 1 week. No wheezing or chest tightness. Did well w/ no SABA use for last 3 months.  Cough came back with recent change in weather and worse with high humidity . Working in outside camp.  She complains cough is keeping her up at night. No discolored mucus. NO chest pain. No overt reflux.  Tessalon not helping. Using otc cough meds.   ROV 11/03/10 -- asthma with exercise component, probably mild persistent by criteria. She has had some difficulty with  managing both the wheeze and cough. She has discovered that carbonated drinks, orange juice are severe exacerbators. Currently on omeprazole qd. Not taking allergy meds at this time. Symbicort bid, ran out over the weekend.      Objective:  Physical Exam  Gen: Pleasant, well-nourished, in no distress,  normal affect  ENT: No lesions,  mouth clear,  oropharynx clear, no postnasal drip, some hoarse voice.   Lungs: No use of accessory muscles, no dullness to percussion, clear without rales or rhonchi  Cardiovascular: RRR, heart sounds normal, no murmur or gallops, no peripheral  edema  Musculoskeletal: No deformities, no cyanosis or clubbing  Neuro: alert, non focal  Skin: Warm, no lesions or rashes   ASTHMA Better control with decrease in carbonated drinks, GERD control - continue current meds - avoid carbonated drinks - she doesn;t want flu shot today - rov 6 mon or prn

## 2010-11-11 ENCOUNTER — Telehealth: Payer: Self-pay | Admitting: Emergency Medicine

## 2010-11-11 NOTE — Telephone Encounter (Signed)
Pt states her mother called the hospital and they are helping with assistance to pay for Symbicort. I placed 2 sample boxes at front desk for pt pick up. Pt is aware and states she will pick it up tomorrow.

## 2010-11-11 NOTE — Telephone Encounter (Signed)
Pt returned call from triage. If you can't catch her before 2pm she asks that you call and speak to her mom jennifer Allums [phone]. Jaclyn Mitchell

## 2010-11-11 NOTE — Telephone Encounter (Signed)
lmomtcb x1 

## 2010-12-21 ENCOUNTER — Other Ambulatory Visit: Payer: Self-pay | Admitting: Emergency Medicine

## 2010-12-23 ENCOUNTER — Other Ambulatory Visit: Payer: Self-pay | Admitting: Emergency Medicine

## 2011-01-05 ENCOUNTER — Telehealth: Payer: Self-pay | Admitting: Emergency Medicine

## 2011-01-05 NOTE — Telephone Encounter (Signed)
LMOMTCB x 1 

## 2011-01-06 NOTE — Telephone Encounter (Signed)
LMOMTCB x 1 

## 2011-01-06 NOTE — Telephone Encounter (Signed)
Pt c/o headache, nasal and head congestion. Pt states has tried Mucinex in the past and caused her to have an upset stomach. I advised pt to try nasal washes once to twice daily and Loratadine 10mg  qhs. Pt okay with this and will call back if worse or no better.

## 2011-01-06 NOTE — Telephone Encounter (Signed)
Returning call from yesterday.  [phone]

## 2011-01-07 NOTE — Telephone Encounter (Signed)
I agree with this plan, thanks

## 2011-01-12 ENCOUNTER — Telehealth: Payer: Self-pay | Admitting: Emergency Medicine

## 2011-01-12 NOTE — Telephone Encounter (Signed)
lmomtcb x1 

## 2011-01-12 NOTE — Telephone Encounter (Signed)
I spoke with pt and she states over the weekend she had some problems with her breathing and developed chest pains on Saturday but this was due to eating spicy food per pt. Pt states then last night she felt SOB and states she has used her rescue inhaler 7 times in the last 24 hrs. I offered OV to have pt come in today but refused and states she only wants to see RB. Pt is scheduled to see RB on Thursday at 9:15. Pt states her breathing seems to be fine right now and had no had to use her rescue inhaler this morning. Pt aware to call back or go to ED if this happens. Will forward to RB so he is aware,

## 2011-01-12 NOTE — Telephone Encounter (Signed)
Thank you - believe this is GERD, possibly triggering her asthma.

## 2011-01-15 ENCOUNTER — Encounter: Payer: Self-pay | Admitting: Emergency Medicine

## 2011-01-15 ENCOUNTER — Telehealth: Payer: Self-pay | Admitting: Emergency Medicine

## 2011-01-15 ENCOUNTER — Ambulatory Visit (INDEPENDENT_AMBULATORY_CARE_PROVIDER_SITE_OTHER): Payer: Self-pay | Admitting: Emergency Medicine

## 2011-01-15 VITALS — BP 118/70 | HR 91 | Temp 97.8°F | Ht 64.0 in | Wt 196.6 lb

## 2011-01-15 DIAGNOSIS — J45909 Unspecified asthma, uncomplicated: Secondary | ICD-10-CM

## 2011-01-15 NOTE — Progress Notes (Signed)
Subjective:    Patient ID: Jaclyn Mitchell, female    DOB: 11-19-1985, 25 y.o.   MRN: [id number]  HPI 25 yo woman, never smoker, dx with asthma as a child. She had to use SABA as a teen when she played softball. Mild persistent asthma.   ROV 10/28/09 -- Returns for asthma. Last time we planned to pre-treat exercise and sports. Developed some URI symptoms about 3 weeks ago, no real fever nasal drainage etc.  Can wake her from sleep and respondes to SABA. Had one episode where she had trouble playing softball even after pre-treating.   ROV 12/05/09 -- f/u for her asthma. Last time we started QVAR 80. She has been taking in morning, not at night because she was having chest pain, burning, ? GERD. Using QVAR in the am has decreased her SABA use. She feels better on it.   March 06, 2010 --Presents for an acute office visit. Complains of intermittent burning in chest x 2 days, and states noticed tachycardia w/ rest x1day. Had severe episode of GERD w/suspected aspiration pneumonitis w/ admission in 08/2009. She says she has on/off burning in chest for last 4 months. Taking Prilosec once daily .Has had more intermittent wheezing and used her proair 3 x this past week. This am ate  sausage biscuit this morning with increased burning in chest. Denies orthopnea, hemoptysis, fever, n/v/d, edema, headache, calf pain, syncope , abdominal pain or bloody symptoms.   ROV 04/04/10 -- excercise induced asthma, has had some persistant symptoms so we started QVAR. She had chest discomfort with the QVAR, increased prilosec but it didn't help. Sounds like the QVAR is causing GERD. No evidence thrush. Stopped QVAR 2 weeks ago and pain resolved. Has been averaging albuterol about two times a day.   ROV 05/09/10 -- asthma, triggers include exercise. Last time we changed to Symbicort as she was having chest pain with QVAR. ? GERD sx, taking omeprazole qd. Change in the weather bothers her, on a few occasions she has had to use  SABA. Having some trouble w allergies since last visit. The GERD seems to be better with Symbicort.   ROV 06/16/10 -- hx asthma, mild persistent with exercise as a trigger, also allergic rhinitis. She returns today for f/u. Since we changed QVAR to Symbicort she has done very well. She has not been needing her SABA, in fact did not take it before a softball game recently and she did well - no breathing problems. She has not been taking her omeprazole regularly.   ROV 06/25/10 -- asthma, often exercise related. Also with allergies and GERD as triggers. Remains on Symbicort bid. She presents with a week of cough, burning in chest last week x 1, led to cough that hasn't gone away. She upped omeprazole to bid with resolution of the GERD sx. Propped her head up in bed.  No wheezing. Has not used her SABA, hasn't needed it.   Acute OV 08/20/2010  Pt complains of  Non-productive cough x 1 week. No wheezing or chest tightness. Did well w/ no SABA use for last 3 months.  Cough came back with recent change in weather and worse with high humidity . Working in outside camp.  She complains cough is keeping her up at night. No discolored mucus. NO chest pain. No overt reflux.  Tessalon not helping. Using otc cough meds.   ROV 11/03/10 -- asthma with exercise component, probably mild persistent by criteria. She has had some difficulty with  managing both the wheeze and cough. She has discovered that carbonated drinks, orange juice are severe exacerbators. Currently on omeprazole qd. Not taking allergy meds at this time. Symbicort bid, ran out over the weekend.   ROV 01/15/11 -- asthma, exercise component, also exacerbated by her GERD. Returns today with about 1 week of increased SOB, cough, chest burning - she had some spicy food Friday, has been taking more carbonated drinks. Having heartburn - in increase prilosec to bid x 1 day. Has used her SABA 7 times over the weekend. She may have ben coming down with a URI as  well. Taking Symbicort bid.      Objective:  Physical Exam  Gen: Pleasant, well-nourished, in no distress,  normal affect  ENT: No lesions,  mouth clear,  oropharynx clear, no postnasal drip, some hoarse voice.   Lungs: No use of accessory muscles, no dullness to percussion, clear without rales or rhonchi  Cardiovascular: RRR, heart sounds normal, no murmur or gallops, no peripheral edema  Musculoskeletal: No deformities, no cyanosis or clubbing  Neuro: alert, non focal  Skin: Warm, no lesions or rashes   ASTHMA - symptomatic URI relief - increase omeprazole to bid x 1 week - symbicort bid - prn saba - f/u 6 months

## 2011-01-15 NOTE — Telephone Encounter (Signed)
1 sample of Symbicort 80/4.5 left at front desk for pt to pick up.  LMOM informing pt of this.

## 2011-01-15 NOTE — Assessment & Plan Note (Signed)
-   symptomatic URI relief - increase omeprazole to bid x 1 week - symbicort bid - prn saba - f/u 6 months

## 2011-01-15 NOTE — Patient Instructions (Addendum)
Try to cut down on your carbonated drinks  Increase your omeprazole to twice a day for a week, then go back to daily Take your Symbicort twice a day Use your rescue inhaler as needed. Call Dr Delton Coombes if your use is not decreasing over the next week.  Follow with Dr Delton Coombes in 6 months or sooner if you have any problems

## 2011-03-05 ENCOUNTER — Telehealth: Payer: Self-pay | Admitting: Emergency Medicine

## 2011-03-05 NOTE — Telephone Encounter (Signed)
Per CY-skip the Symbicort for 2 days then try again; is it causing hard cough, palpitation, or acid heartburn?

## 2011-03-05 NOTE — Telephone Encounter (Signed)
Spoke with patient-states that yesterday it felt like severe heartburn after using Symbicort-per CY have patient try taking liquid Maalox or Gaviscon prior to use. Pt understands and will call back and report to RB on Monday.

## 2011-03-05 NOTE — Telephone Encounter (Signed)
lmomtcb x1 

## 2011-03-05 NOTE — Telephone Encounter (Signed)
Pt states that for the past 2 days she has pain in the RUQ after using her Symbicort each time. The pain last anywhere from 10 minutes to an hour and brought her to tears this morning. She denies any increased sob or wheezing and doesn't feel congested. RB is out of the office and we will forward msg to Dr. Maple Hudson, doc of the day, for recs. Pls advise. Allergies  Allergen Reactions  . Ceftriaxone Sodium     REACTION: given via IV during hosp stay 08/2009 - pt states caused rash

## 2011-03-06 ENCOUNTER — Telehealth: Payer: Self-pay | Admitting: Emergency Medicine

## 2011-03-06 NOTE — Telephone Encounter (Signed)
Called and spoke with pt and she stated that she tried to use the symbicort again this morning and her heartburn came back.  Explained to her that per phone note yesterday  CY recs were to hold the symbicort for 2 days and then restart.  Pt voiced her understanding of this so she will hold the symbicort for 2 days and then restart on Monday and take the mylanta 30 minutes before taking the symbicort.  Pt to call back on Monday if any further problems.

## 2011-03-09 ENCOUNTER — Telehealth: Payer: Self-pay | Admitting: Emergency Medicine

## 2011-03-09 NOTE — Telephone Encounter (Signed)
Pt returned call & requests to not make an appointment & would like to know if her cough medication previously prescribed would work.  Pt also stated if she does have to get an appt., she will only see RB.  Antionette Fairy

## 2011-03-09 NOTE — Telephone Encounter (Signed)
I spoke with pt and she is scheduled to come in and see VS tomorrow at 2:15. Pt states she will call back if she needs to reschedule this

## 2011-03-09 NOTE — Telephone Encounter (Signed)
No openings with RB- is she is sick can see any of our providers LMTCB x 1

## 2011-03-09 NOTE — Telephone Encounter (Signed)
LMOMCTb x1 to offer OV for pt

## 2011-03-09 NOTE — Telephone Encounter (Signed)
Pt returned call. I advised her per epic msg. that we would be glad to have one of our providers see her tomorrow or anytime this week.  She says she will just keep the appt w/ RB for 03/30/11 but will call if she feels she needs to be seen sooner. Nothing further needed at this time and I will close this encounter. Jaclyn Mitchell

## 2011-03-09 NOTE — Telephone Encounter (Signed)
lmomtcb x1 for pt 

## 2011-03-09 NOTE — Telephone Encounter (Signed)
Called and spoke with pt. Offered her appts today but pt declined stating she could not get off work.  She requested an appt for tomorrow.  Offered appt with TP tomorrow.  Pt stated she would have to check her schedule first and will call us back.

## 2011-03-10 ENCOUNTER — Ambulatory Visit: Payer: Self-pay | Admitting: Pulmonary Disease

## 2011-03-20 ENCOUNTER — Telehealth: Payer: Self-pay | Admitting: Emergency Medicine

## 2011-03-20 NOTE — Telephone Encounter (Signed)
I spoke with pt and she is wanting to know if she can play volleyball 2 nights a week and volleyball 2 nights per week. She is concerned since her recent asthma problems. Please advise Dr. Delton Coombes, thanks

## 2011-03-23 NOTE — Telephone Encounter (Signed)
Message signed in error.  Will forward back to RB.

## 2011-03-23 NOTE — Telephone Encounter (Signed)
Dr Delton Coombes please advise thanks.

## 2011-03-24 NOTE — Telephone Encounter (Signed)
Addressed on separate note.

## 2011-03-24 NOTE — Telephone Encounter (Signed)
Please let the patient know I want her to try to play, but she needs to use her SABA before starting to exercise, Thanks

## 2011-03-30 ENCOUNTER — Ambulatory Visit (INDEPENDENT_AMBULATORY_CARE_PROVIDER_SITE_OTHER): Payer: Self-pay | Admitting: Emergency Medicine

## 2011-03-30 ENCOUNTER — Encounter: Payer: Self-pay | Admitting: Emergency Medicine

## 2011-03-30 DIAGNOSIS — J45909 Unspecified asthma, uncomplicated: Secondary | ICD-10-CM

## 2011-03-30 DIAGNOSIS — K219 Gastro-esophageal reflux disease without esophagitis: Secondary | ICD-10-CM

## 2011-03-30 NOTE — Assessment & Plan Note (Signed)
On omeprazole + lansoprazole qod

## 2011-03-30 NOTE — Patient Instructions (Addendum)
Please continue your Symbicort twice a day Use your albuterol as needed Continue your omeprazole and lansoprazole  Follow with Dr Delton Coombes in 6 months or sooner if you have any problems

## 2011-03-30 NOTE — Progress Notes (Signed)
Subjective:    Patient ID: Jaclyn Mitchell, female    DOB: 02-Oct-1985, 26 y.o.   MRN: [id number]  HPI 26 yo woman, never smoker, dx with asthma as a child. She had to use SABA as a teen when she played softball. Mild persistent asthma.   ROV 10/28/09 -- Returns for asthma. Last time we planned to pre-treat exercise and sports. Developed some URI symptoms about 3 weeks ago, no real fever nasal drainage etc.  Can wake her from sleep and respondes to SABA. Had one episode where she had trouble playing softball even after pre-treating.   ROV 12/05/09 -- f/u for her asthma. Last time we started QVAR 80. She has been taking in morning, not at night because she was having chest pain, burning, ? GERD. Using QVAR in the am has decreased her SABA use. She feels better on it.   March 06, 2010 --Presents for an acute office visit. Complains of intermittent burning in chest x 2 days, and states noticed tachycardia w/ rest x1day. Had severe episode of GERD w/suspected aspiration pneumonitis w/ admission in 08/2009. She says she has on/off burning in chest for last 4 months. Taking Prilosec once daily .Has had more intermittent wheezing and used her proair 3 x this past week. This am ate  sausage biscuit this morning with increased burning in chest. Denies orthopnea, hemoptysis, fever, n/v/d, edema, headache, calf pain, syncope , abdominal pain or bloody symptoms.   ROV 04/04/10 -- excercise induced asthma, has had some persistant symptoms so we started QVAR. She had chest discomfort with the QVAR, increased prilosec but it didn't help. Sounds like the QVAR is causing GERD. No evidence thrush. Stopped QVAR 2 weeks ago and pain resolved. Has been averaging albuterol about two times a day.   ROV 05/09/10 -- asthma, triggers include exercise. Last time we changed to Symbicort as she was having chest pain with QVAR. ? GERD sx, taking omeprazole qd. Change in the weather bothers her, on a few occasions she has had to use  SABA. Having some trouble w allergies since last visit. The GERD seems to be better with Symbicort.   ROV 06/16/10 -- hx asthma, mild persistent with exercise as a trigger, also allergic rhinitis. She returns today for f/u. Since we changed QVAR to Symbicort she has done very well. She has not been needing her SABA, in fact did not take it before a softball game recently and she did well - no breathing problems. She has not been taking her omeprazole regularly.   ROV 06/25/10 -- asthma, often exercise related. Also with allergies and GERD as triggers. Remains on Symbicort bid. She presents with a week of cough, burning in chest last week x 1, led to cough that hasn't gone away. She upped omeprazole to bid with resolution of the GERD sx. Propped her head up in bed.  No wheezing. Has not used her SABA, hasn't needed it.   Acute OV 08/20/2010  Pt complains of  Non-productive cough x 1 week. No wheezing or chest tightness. Did well w/ no SABA use for last 3 months.  Cough came back with recent change in weather and worse with high humidity . Working in outside camp.  She complains cough is keeping her up at night. No discolored mucus. NO chest pain. No overt reflux.  Tessalon not helping. Using otc cough meds.   ROV 11/03/10 -- asthma with exercise component, probably mild persistent by criteria. She has had some difficulty with  managing both the wheeze and cough. She has discovered that carbonated drinks, orange juice are severe exacerbators. Currently on omeprazole qd. Not taking allergy meds at this time. Symbicort bid, ran out over the weekend.   ROV 01/15/11 -- asthma, exercise component, also exacerbated by her GERD. Returns today with about 1 week of increased SOB, cough, chest burning - she had some spicy food Friday, has been taking more carbonated drinks. Having heartburn - in increase prilosec to bid x 1 day. Has used her SABA 7 times over the weekend. She may have ben coming down with a URI as  well. Taking Symbicort bid.   ROV 03/30/11 -- asthma with exercise as trigger, also GERD, sometimes allergies. Her GERD has been labile, also has had some breathing difficulty associated with weather change. Also triggered recently due to perfume. She is on omeprazole every day and prevacid qod. No other exacerbations since last time. Wants to know if she can play volleyball and softball.     Objective:  Physical Exam  Gen: Pleasant, well-nourished, in no distress,  normal affect  ENT: No lesions,  mouth clear,  oropharynx clear, no postnasal drip, some hoarse voice.   Lungs: No use of accessory muscles, no dullness to percussion, clear without rales or rhonchi  Cardiovascular: RRR, heart sounds normal, no murmur or gallops, no peripheral edema  Musculoskeletal: No deformities, no cyanosis or clubbing  Neuro: alert, non focal  Skin: Warm, no lesions or rashes   ASTHMA Continue current regimen  G E R D On omeprazole + lansoprazole qod

## 2011-03-30 NOTE — Assessment & Plan Note (Signed)
Continue current regimen

## 2011-05-11 ENCOUNTER — Telehealth: Payer: Self-pay | Admitting: Emergency Medicine

## 2011-05-11 NOTE — Telephone Encounter (Signed)
ATC pt but had bad cell reception. Will await pt call back

## 2011-05-11 NOTE — Telephone Encounter (Signed)
Pt returned triage's call.  Holly D Pryor ° °

## 2011-05-11 NOTE — Telephone Encounter (Signed)
Pt called back again. Says this is just an FYI - no need to call back. Jaclyn Mitchell

## 2011-05-11 NOTE — Telephone Encounter (Signed)
lmomtcb x1 

## 2011-05-12 NOTE — Telephone Encounter (Signed)
Fantastic, thank you!

## 2011-05-12 NOTE — Telephone Encounter (Signed)
Will forward to Dr Delton Coombes for his FYI.

## 2011-05-15 ENCOUNTER — Telehealth: Payer: Self-pay | Admitting: Emergency Medicine

## 2011-05-15 NOTE — Telephone Encounter (Signed)
I spoke with pt and is aware 1 sample of the symbicort80 has been left upfront for p/u. Nothing further was needed

## 2011-07-07 ENCOUNTER — Telehealth: Payer: Self-pay | Admitting: Emergency Medicine

## 2011-07-07 MED ORDER — FLUTICASONE-SALMETEROL 100-50 MCG/DOSE IN AEPB: 1.0000 | INHALATION_SPRAY | Freq: Two times a day (BID) | RESPIRATORY_TRACT | Status: DC

## 2011-07-07 NOTE — Telephone Encounter (Signed)
Spoke with patient-states she is on Symbicort-usually doesn't take everyday as she should but when she uses it she has noticed that she is having "star like sights in her eyes" -would like to know if this is a side effect and if she should stop this and try another inhaler. SN, as doc of day and RB not back until Thursday please advise. Thanks.

## 2011-07-07 NOTE — Telephone Encounter (Signed)
Per SN-have patient use Advair 100/50 1 puff bid and Rinse mouth after use. Stop Symbicort-pt is aware that RX has been sent to [facility] and will keep RB posted on how this works. Pt would also like RB to know that she has been 140 days with out carbonated beverages and 1 year without caffeine.

## 2011-07-08 NOTE — Telephone Encounter (Signed)
Thank you, will check w her to see how she tolerates

## 2011-07-17 ENCOUNTER — Telehealth: Payer: Self-pay | Admitting: Emergency Medicine

## 2011-07-17 NOTE — Telephone Encounter (Signed)
Advised pt that 2 samples of advair is waiting for her, but we do not have any samples of proair.  Pt acknowledged understanding & stated nothing further needed at this time.  Antionette Fairy

## 2011-07-17 NOTE — Telephone Encounter (Signed)
Left 2 samples of advair upfront but we have no proair--lmomtcb x1 for pt to make aware

## 2011-07-31 ENCOUNTER — Telehealth: Payer: Self-pay | Admitting: Emergency Medicine

## 2011-07-31 NOTE — Telephone Encounter (Signed)
I spoke with the pt and she states since she has been on advair x 2 weeks she has been having to use her rescue inhaler more often. She states she is using it at least twice a day. Pt states she was changed from symbicort to advair due to vision disturbances. Pt states she still has some symbicort at home. She wanted to know soul she try this again or is there another option for her. Pt advised RB will return on Monday. Pt wants to wait for his return. Carron Curie, CMA

## 2011-07-31 NOTE — Telephone Encounter (Signed)
lmomtcb x1 

## 2011-08-03 ENCOUNTER — Telehealth: Payer: Self-pay | Admitting: Emergency Medicine

## 2011-08-03 MED ORDER — PREDNISONE 10 MG PO TABS: ORAL_TABLET | ORAL | Status: DC

## 2011-08-03 NOTE — Telephone Encounter (Signed)
I think it is OK for her to go back to the Symbicort as long as she tracks her symptoms, is sure to call us back if she continues to use more SABA.

## 2011-08-03 NOTE — Telephone Encounter (Signed)
Pt aware of MR recs. She agree'd to have the pred taper called in but will hold off on the levaquin for now. RX has been sent for the pred taper. Will forward to RB as an [place]

## 2011-08-03 NOTE — Telephone Encounter (Signed)
Pt returned triage's call.  Holly D Pryor ° °

## 2011-08-03 NOTE — Telephone Encounter (Signed)
LMOMTCB x 1 

## 2011-08-03 NOTE — Telephone Encounter (Signed)
Called, spoke with pt who states she is having coughing spells.  Cough is worse when laying down and when sleeping.  Report she does have some SOB when coughing but not much, sharp chest pains when coughing, and chest heaviness after coughing.  Denies wheezing.  States she is using proair twice daily with relief on the SOB but no relief for cough.  Offered OV but pt unable to come in -- would like recs on the cough.  Per triage protocol, because it is after 5 pm, will send msg to the doc of the day to address.  MR, pls advise.  Thank you.  Note:  Per phone msg from earlier today, advair was changed to symbicort.

## 2011-08-03 NOTE — Telephone Encounter (Signed)
I spoke with pt and is aware of RB recs. She states she will try the symbicort again and will call back if she has any problems. Nothing further was needed

## 2011-08-03 NOTE — Telephone Encounter (Signed)
Sounds like asthma flare  Please take Take prednisone 40mg  once daily x 3 days, then 30mg  once daily x 3 days, then 20mg  once daily x 3 days, then prednisone 10mg  once daily  x 3 days and stop   If having cough, fever, any sputum then should also take  take levaquin 500mg  once daily  X 5 days (ensure she is not pregnant)

## 2011-08-04 NOTE — Telephone Encounter (Signed)
I suspect that this was UA irritation from the Advair - she has had this problem before. She can go ahead and take the prednisone since it has already been started. Will follow her.

## 2011-08-07 ENCOUNTER — Telehealth: Payer: Self-pay | Admitting: Emergency Medicine

## 2011-08-07 NOTE — Telephone Encounter (Signed)
Pt is aware of RB recs. Pt states she has not even made it to the pharmacy yet to start the prednisone but is going this afternoon to pick this up. She has been taking her reflux medication. She states she wants to try the pred taper to see if it helps. Pt did not need have any further questions. Will forward to RB so he knows pt never started the prednisone.

## 2011-08-07 NOTE — Telephone Encounter (Signed)
She just changed back to symbicort - I suspected that she was having UA irritation and cough. She is also taking a prednisone taper right now, which could exacerbate GERD. GERD has always been one of her worst triggers. I believe we should try to continue the symbicort bid for now, insure that she is taking her GERD medication. I would be tempted to tell her to stop the prednisone, especially if she hasn't benefited. She could always restart the pred if she worsened after stopping it.

## 2011-08-07 NOTE — Telephone Encounter (Signed)
Called and spoke with pt and she stated that she has been on symbicort x 4 days.  She has still been using her rescue inhaler 3-4 times per day.  Last night she started coughing and wheezing.  This has stopped now.  She is wanting to know if she needs to start on something else or if she needs to give the symbicort more time to get in her system.  RB please advise. thanks

## 2011-08-08 NOTE — Telephone Encounter (Signed)
Thank you. Will follow her response

## 2011-09-03 ENCOUNTER — Telehealth: Payer: Self-pay | Admitting: Emergency Medicine

## 2011-09-03 NOTE — Telephone Encounter (Signed)
Called spoke with patient who stated that she was unable to remember the medication that she is allergic to.  Advised pt that we have documented that she is allergic to rocephin.  Pt verbalized her understanding. Will sign off.

## 2011-09-03 NOTE — Telephone Encounter (Signed)
LMOM TCB x1.  It is documented in pt's chart that she is allergic to rocephin.

## 2011-09-03 NOTE — Telephone Encounter (Signed)
Patient is returning call.  °

## 2011-10-05 ENCOUNTER — Ambulatory Visit (INDEPENDENT_AMBULATORY_CARE_PROVIDER_SITE_OTHER): Payer: Self-pay | Admitting: Emergency Medicine

## 2011-10-05 ENCOUNTER — Telehealth: Payer: Self-pay | Admitting: Emergency Medicine

## 2011-10-05 ENCOUNTER — Encounter: Payer: Self-pay | Admitting: Emergency Medicine

## 2011-10-05 VITALS — BP 130/82 | HR 94 | Temp 98.3°F | Ht 63.0 in | Wt 200.2 lb

## 2011-10-05 DIAGNOSIS — J45909 Unspecified asthma, uncomplicated: Secondary | ICD-10-CM

## 2011-10-05 NOTE — Patient Instructions (Addendum)
Restart omeprazole 20mg  daily Increase your Symbicort to 2 puffs twice a day Use your albuterol as needed Follow with Dr Delton Coombes in 6 months or sooner if you have any problems

## 2011-10-05 NOTE — Telephone Encounter (Signed)
lmomtcb x1 

## 2011-10-05 NOTE — Progress Notes (Signed)
Subjective:    Patient ID: Jaclyn Mitchell, female    DOB: 07/10/1985, 26 y.o.   MRN: [id number]  HPI 26 yo woman, never smoker, dx with asthma as a child. She had to use SABA as a teen when she played softball. Mild persistent asthma.   ROV 10/28/09 -- Returns for asthma. Last time we planned to pre-treat exercise and sports. Developed some URI symptoms about 3 weeks ago, no real fever nasal drainage etc.  Can wake her from sleep and respondes to SABA. Had one episode where she had trouble playing softball even after pre-treating.   ROV 12/05/09 -- f/u for her asthma. Last time we started QVAR 80. She has been taking in morning, not at night because she was having chest pain, burning, ? GERD. Using QVAR in the am has decreased her SABA use. She feels better on it.   March 06, 2010 --Presents for an acute office visit. Complains of intermittent burning in chest x 2 days, and states noticed tachycardia w/ rest x1day. Had severe episode of GERD w/suspected aspiration pneumonitis w/ admission in 08/2009. She says she has on/off burning in chest for last 4 months. Taking Prilosec once daily .Has had more intermittent wheezing and used her proair 3 x this past week. This am ate  sausage biscuit this morning with increased burning in chest. Denies orthopnea, hemoptysis, fever, n/v/d, edema, headache, calf pain, syncope , abdominal pain or bloody symptoms.   ROV 04/04/10 -- excercise induced asthma, has had some persistant symptoms so we started QVAR. She had chest discomfort with the QVAR, increased prilosec but it didn't help. Sounds like the QVAR is causing GERD. No evidence thrush. Stopped QVAR 2 weeks ago and pain resolved. Has been averaging albuterol about two times a day.   ROV 05/09/10 -- asthma, triggers include exercise. Last time we changed to Symbicort as she was having chest pain with QVAR. ? GERD sx, taking omeprazole qd. Change in the weather bothers her, on a few occasions she has had to use  SABA. Having some trouble w allergies since last visit. The GERD seems to be better with Symbicort.   ROV 06/16/10 -- hx asthma, mild persistent with exercise as a trigger, also allergic rhinitis. She returns today for f/u. Since we changed QVAR to Symbicort she has done very well. She has not been needing her SABA, in fact did not take it before a softball game recently and she did well - no breathing problems. She has not been taking her omeprazole regularly.   ROV 06/25/10 -- asthma, often exercise related. Also with allergies and GERD as triggers. Remains on Symbicort bid. She presents with a week of cough, burning in chest last week x 1, led to cough that hasn't gone away. She upped omeprazole to bid with resolution of the GERD sx. Propped her head up in bed.  No wheezing. Has not used her SABA, hasn't needed it.   Acute OV 08/20/2010  Pt complains of  Non-productive cough x 1 week. No wheezing or chest tightness. Did well w/ no SABA use for last 3 months.  Cough came back with recent change in weather and worse with high humidity . Working in outside camp.  She complains cough is keeping her up at night. No discolored mucus. NO chest pain. No overt reflux.  Tessalon not helping. Using otc cough meds.   ROV 11/03/10 -- asthma with exercise component, probably mild persistent by criteria. She has had some difficulty with  managing both the wheeze and cough. She has discovered that carbonated drinks, orange juice are severe exacerbators. Currently on omeprazole qd. Not taking allergy meds at this time. Symbicort bid, ran out over the weekend.   ROV 01/15/11 -- asthma, exercise component, also exacerbated by her GERD. Returns today with about 1 week of increased SOB, cough, chest burning - she had some spicy food Friday, has been taking more carbonated drinks. Having heartburn - in increase prilosec to bid x 1 day. Has used her SABA 7 times over the weekend. She may have ben coming down with a URI as  well. Taking Symbicort bid.   ROV 03/30/11 -- asthma with exercise as trigger, also GERD, sometimes allergies. Her GERD has been labile, also has had some breathing difficulty associated with weather change. Also triggered recently due to perfume. She is on omeprazole every day and prevacid qod. No other exacerbations since last time. Wants to know if she can play volleyball and softball.   ROV 10/05/11 -- asthma with exercise as trigger, also GERD, sometimes allergies. Since last time, we changed temporarily to Advair because she was worried that Symbicort was bothering her vision. Now back to Symbicort. Not currently taking any GERD meds, only taking Symbicort once a day on most days. Using her albuterol about daily. She is having occasional dyspnea, wheeze, about every day. She gave up softball because she is frequently out of town.     Objective:  Physical Exam Filed Vitals:   10/05/11 1329  BP: 130/82  Pulse: 94  Temp: 98.3 F (36.8 C)   Gen: Pleasant, well-nourished, in no distress,  normal affect  ENT: No lesions,  mouth clear,  oropharynx clear, no postnasal drip, some hoarse voice.   Lungs: No use of accessory muscles, no dullness to percussion, clear without rales or rhonchi  Cardiovascular: RRR, heart sounds normal, no murmur or gallops, no peripheral edema  Musculoskeletal: No deformities, no cyanosis or clubbing  Neuro: alert, non focal  Skin: Warm, no lesions or rashes   ASTHMA - will get her back on Symbicort bid - albuterol prn - will restart omeprazole and follow SABA usage.  - rov 6 mon

## 2011-10-05 NOTE — Assessment & Plan Note (Signed)
-   will get her back on Symbicort bid - albuterol prn - will restart omeprazole and follow SABA usage.  - rov 6 mon

## 2011-10-05 NOTE — Telephone Encounter (Signed)
Pt returned triage's call.  Holly D Pryor ° °

## 2011-10-06 NOTE — Telephone Encounter (Signed)
[phone] please call back returning call

## 2011-10-06 NOTE — Telephone Encounter (Signed)
lmomtcb  

## 2011-10-06 NOTE — Telephone Encounter (Signed)
I will tell him, thanks

## 2011-10-06 NOTE — Telephone Encounter (Signed)
Spoke with Jaclyn Mitchell Dr. Delton Coombes, the Jaclyn Mitchell wants you to please tell Dr. Molli Knock how well she is doing She was unable to find him at [facility] yesterday I would send this to him, but unsure how often he checks his basket, so will forward to you to please pass along her msg, thanks!

## 2011-10-08 ENCOUNTER — Telehealth: Payer: Self-pay | Admitting: Emergency Medicine

## 2011-10-08 NOTE — Telephone Encounter (Signed)
lmomtcb informing pt nurse needs to speak with her to get additional information regarding this.

## 2011-10-09 NOTE — Telephone Encounter (Signed)
It is possibly related. Have her go back to her usual dosing and then let us know how she does

## 2011-10-09 NOTE — Telephone Encounter (Signed)
lmomtcb x1 

## 2011-10-09 NOTE — Telephone Encounter (Signed)
Called, spoke with pt.  Reports she increased symbicort to 2 puffs bid.  After doing this, for the last 2 days she has woken up with a "really bad HA."  States the HA was unbearable.  Would like to know if this could be from the symbicort and if she could go back to once daily as long as asthma isn't acting up.  Dr. Delton Coombes, pls advise.  Thank you.

## 2011-10-09 NOTE — Telephone Encounter (Signed)
Returning call.

## 2011-10-09 NOTE — Telephone Encounter (Signed)
LMTCB

## 2011-10-09 NOTE — Telephone Encounter (Signed)
Pt returned call.  Jaclyn Mitchell ° °

## 2011-10-09 NOTE — Telephone Encounter (Signed)
Called, spoke with pt for a brief minute but then the call was disconnected.  I called right back and had to lmomtcb

## 2011-10-12 NOTE — Telephone Encounter (Signed)
Pt is aware. Jaclyn Mitchell, CMA  

## 2011-10-14 ENCOUNTER — Telehealth: Payer: Self-pay | Admitting: Emergency Medicine

## 2011-10-14 ENCOUNTER — Emergency Department (HOSPITAL_COMMUNITY)
Admission: EM | Admit: 2011-10-14 | Discharge: 2011-10-14 | Disposition: A | Discharge status: Home / Self Care | Payer: Self-pay | Attending: Emergency Medicine | Admitting: Emergency Medicine

## 2011-10-14 ENCOUNTER — Emergency Department (HOSPITAL_COMMUNITY): Payer: Self-pay

## 2011-10-14 ENCOUNTER — Encounter (HOSPITAL_COMMUNITY): Payer: Self-pay | Admitting: *Deleted

## 2011-10-14 DIAGNOSIS — K219 Gastro-esophageal reflux disease without esophagitis: Secondary | ICD-10-CM | POA: Insufficient documentation

## 2011-10-14 DIAGNOSIS — Z8489 Family history of other specified conditions: Secondary | ICD-10-CM | POA: Insufficient documentation

## 2011-10-14 DIAGNOSIS — J45901 Unspecified asthma with (acute) exacerbation: Secondary | ICD-10-CM | POA: Insufficient documentation

## 2011-10-14 DIAGNOSIS — Z880 Allergy status to penicillin: Secondary | ICD-10-CM | POA: Insufficient documentation

## 2011-10-14 DIAGNOSIS — Z8249 Family history of ischemic heart disease and other diseases of the circulatory system: Secondary | ICD-10-CM | POA: Insufficient documentation

## 2011-10-14 DIAGNOSIS — Z825 Family history of asthma and other chronic lower respiratory diseases: Secondary | ICD-10-CM | POA: Insufficient documentation

## 2011-10-14 DIAGNOSIS — J45909 Unspecified asthma, uncomplicated: Secondary | ICD-10-CM

## 2011-10-14 DIAGNOSIS — Z888 Allergy status to other drugs, medicaments and biological substances status: Secondary | ICD-10-CM | POA: Insufficient documentation

## 2011-10-14 IMAGING — CR DG CHEST 2V
2 series · 2 of 2 positions shown · non-contrast
Comparison: [DATE]

CLINICAL DATA: Shortness of breath

CHEST - 2 VIEW

[w chest pa]
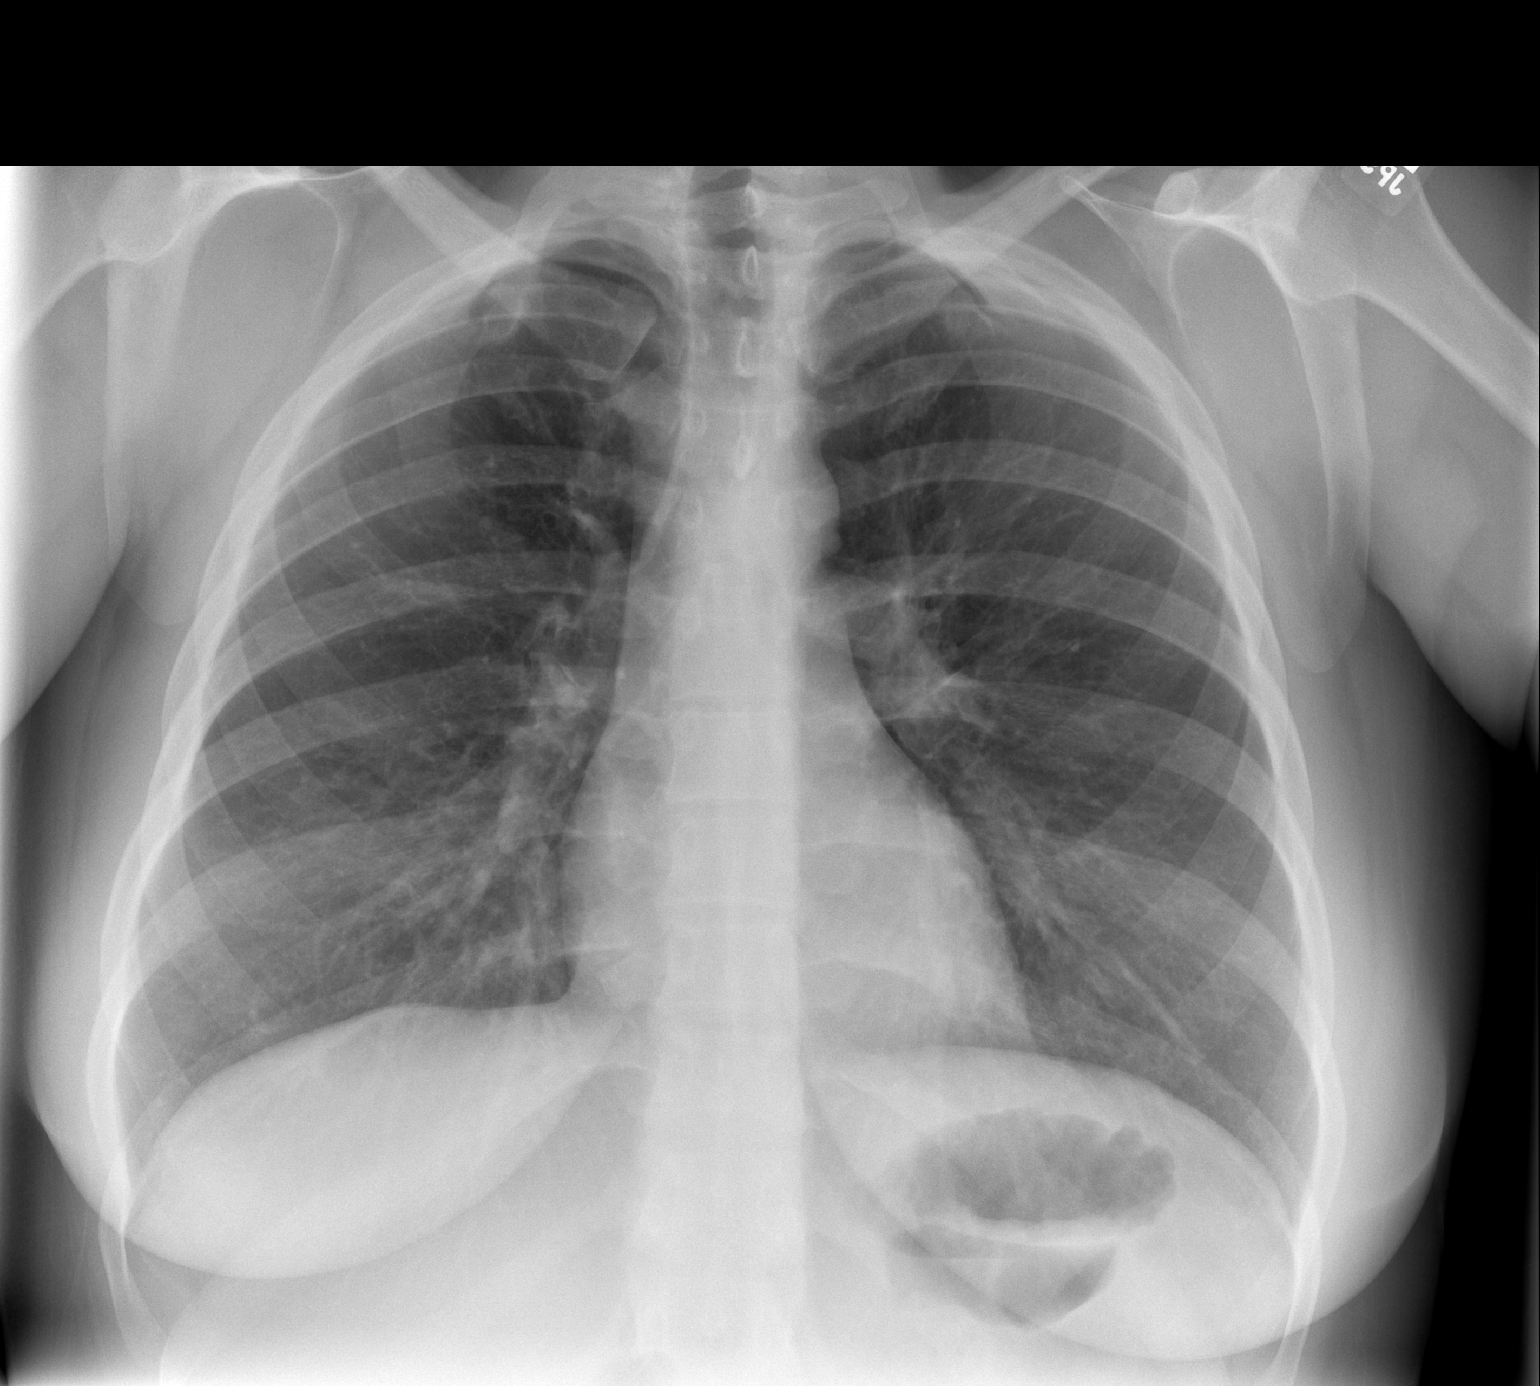

[w chest lat]
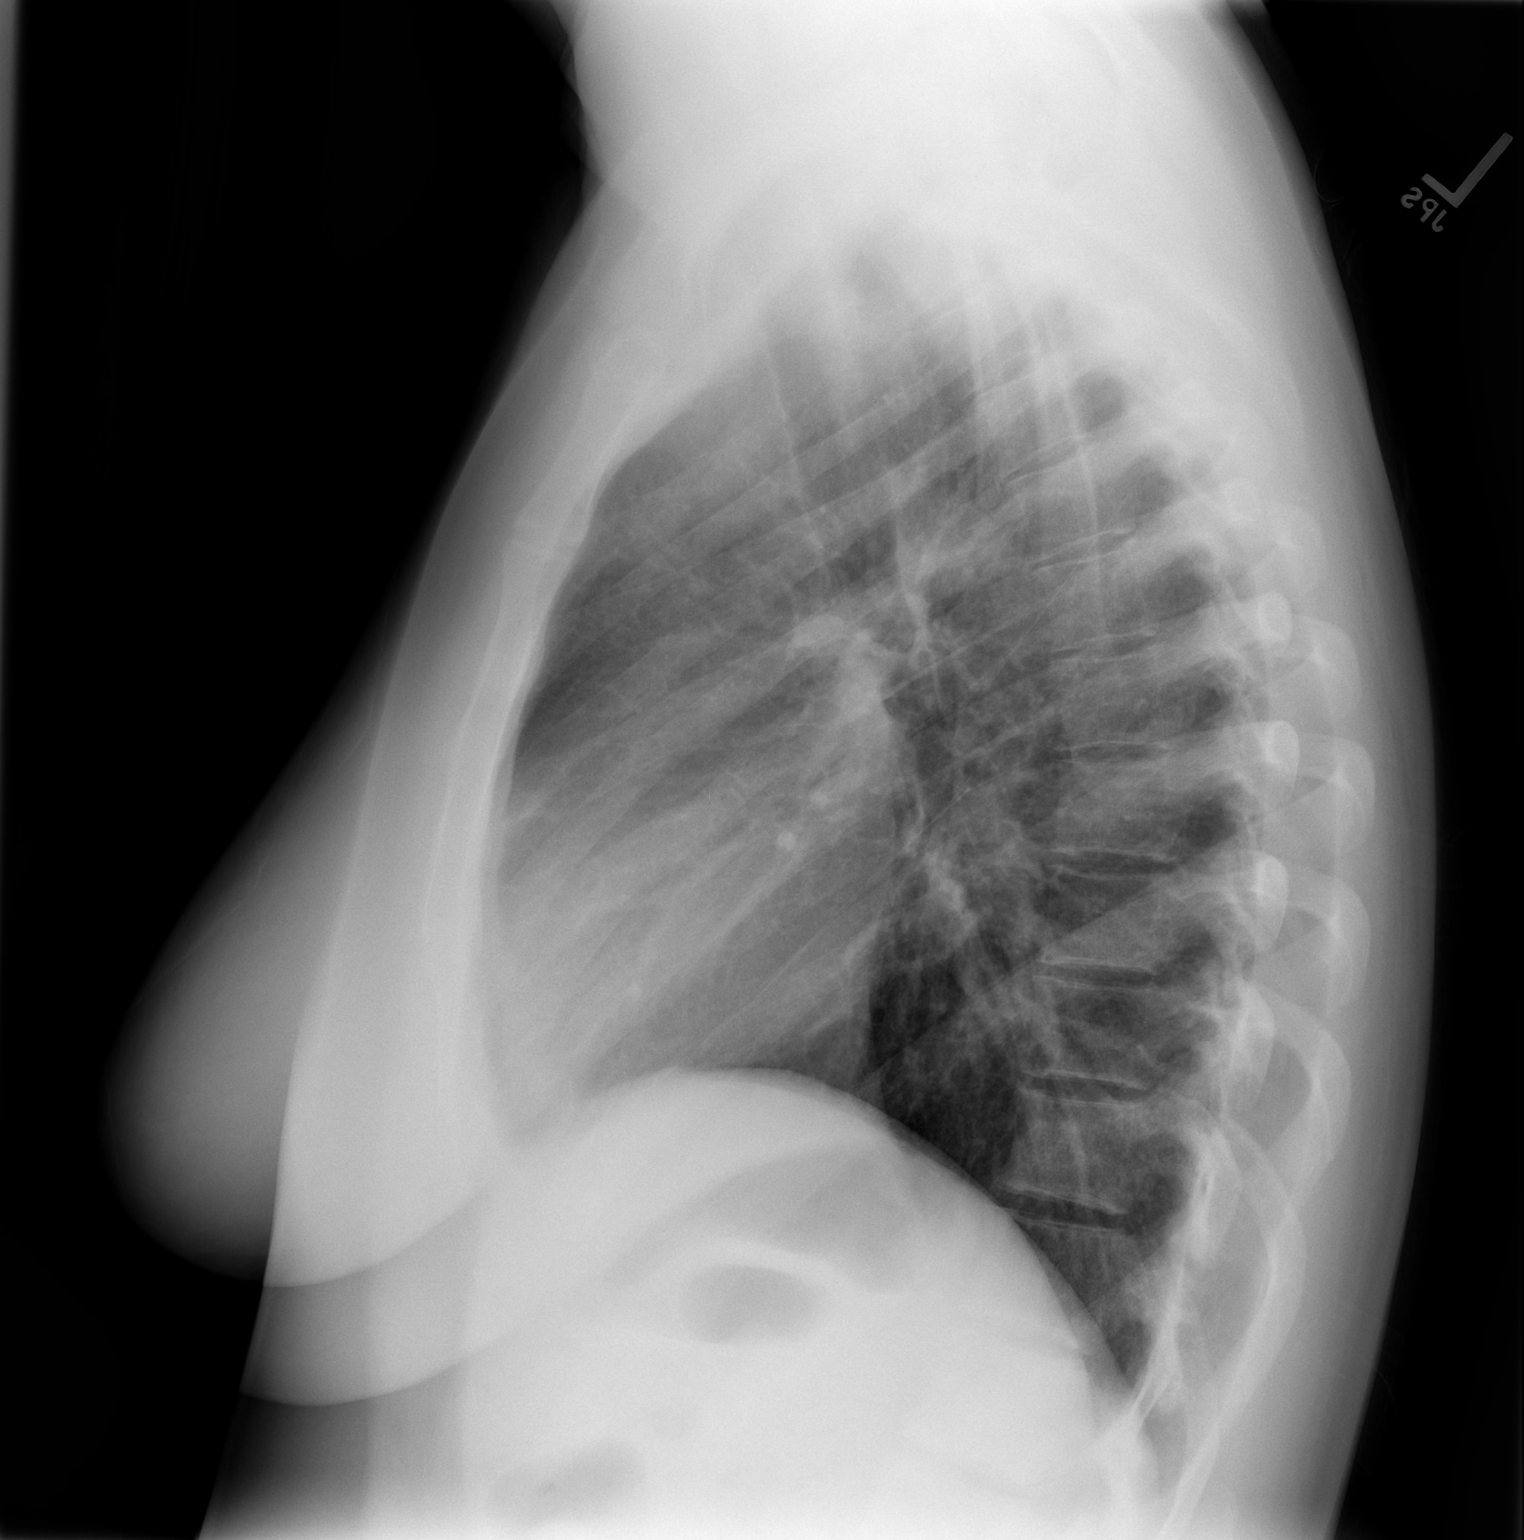

[2 of 2 positions shown; findings below may reference images not displayed]

FINDINGS: A linear opacity within the right upper lung is likely
atelectasis. Mild peribronchial thickening.  Lungs are otherwise
clear. No pleural effusion or pneumothorax. The cardiomediastinal
contours are within normal limits. The visualized bones and soft
tissues are without significant appreciable abnormality.
IMPRESSION: Mild linear opacity right upper lung is likely
atelectasis.  Mild chronic peribronchial thickening. No focal
consolidation otherwise.

## 2011-10-14 MED ORDER — ALBUTEROL SULFATE (5 MG/ML) 0.5% IN NEBU
5.0000 mg | INHALATION_SOLUTION | Freq: Once | RESPIRATORY_TRACT | Status: AC
  Administered 2011-10-14: 5 mg via RESPIRATORY_TRACT
  Filled 2011-10-14: qty 1

## 2011-10-14 MED ORDER — ALBUTEROL SULFATE (2.5 MG/3ML) 0.083% IN NEBU: 2.5000 mg | INHALATION_SOLUTION | RESPIRATORY_TRACT | Status: DC | PRN

## 2011-10-14 MED ORDER — GI COCKTAIL ~~LOC~~
30.0000 mL | Freq: Once | ORAL | Status: AC
  Administered 2011-10-14: 30 mL via ORAL
  Filled 2011-10-14: qty 30

## 2011-10-14 NOTE — ED Notes (Signed)
C/o "woke up and could not breathe, tried my inhaler and it did not help". "H/o similar 2 years ago and it was acid reflux". Pt has seen Dr. Molli Knock (PCCM) in the past. (Denies: pain, nvd, fever or congestion). Mentions dry cough.

## 2011-10-14 NOTE — Telephone Encounter (Signed)
Copy of reflux diet left at front for pt.  Order for neb sent to [facility].  [facility] [place].

## 2011-10-14 NOTE — Telephone Encounter (Signed)
Pt also wants to know what she has to do to get a neb machine.

## 2011-10-14 NOTE — ED Provider Notes (Signed)
History     CSN: [id number]  Arrival date & time 10/14/11  7829   First MD Initiated Contact with Patient 10/14/11 [phone]      Chief Complaint  Patient presents with  . Shortness of Breath    (Consider location/radiation/quality/duration/timing/severity/associated sxs/prior treatment) HPI Comments: Patient with history of GERD, asthma.  Presents complaining of waking in the night with shortness of breath, discomfort in her throat and chest.  She had a similar episode in the past and was here in the hospital for 10 days for what sounds like aspiration.  She took her inhaler with minimal relief.  Feeling somewhat better now.  Patient is a 26 y.o. female presenting with shortness of breath. The history is provided by the patient.  Shortness of Breath  The current episode started today (2 AM). The onset was sudden. The problem occurs occasionally. The problem has been gradually improving. The problem is moderate. Nothing relieves the symptoms. Nothing aggravates the symptoms. Associated symptoms include shortness of breath. Her past medical history is significant for asthma. Urine output has been normal.    Past Medical History  Diagnosis Date  . Aspiration pneumonia   . GERD (gastroesophageal reflux disease)   . Asthma     History reviewed. No pertinent past surgical history.  Family History  Problem Relation Age of Onset  . Heart attack Maternal Grandmother   . Asthma Father   . Asthma Paternal Uncle   . Thyroid disease Maternal Grandmother     History  Substance Use Topics  . Smoking status: Never Smoker   . Smokeless tobacco: Never Used  . Alcohol Use: No    OB History    Grav Para Term Preterm Abortions TAB SAB Ect Mult Living                  Review of Systems  Respiratory: Positive for shortness of breath.   All other systems reviewed and are negative.    Allergies  Ceftriaxone sodium and Penicillins  Home Medications   Current Outpatient Rx  Name  Route Sig Dispense Refill  . ALBUTEROL SULFATE HFA 108 (90 BASE) MCG/ACT IN AERS Inhalation Inhale 2 puffs into the lungs every 6 (six) hours as needed. For breathing    . BUDESONIDE-FORMOTEROL FUMARATE 80-4.5 MCG/ACT IN AERO Inhalation Inhale 2 puffs into the lungs 2 (two) times daily.      . IBUPROFEN 200 MG PO TABS Oral Take 200 mg by mouth every 6 (six) hours as needed. For pain    . OMEPRAZOLE 20 MG PO CPDR Oral Take 20 mg by mouth daily.       BP 121/58  Temp 97.6 F (36.4 C) (Oral)  Resp 22  SpO2 99%  LMP 10/07/2011  Physical Exam  Nursing note and vitals reviewed. Constitutional: She is oriented to person, place, and time. She appears well-developed and well-nourished. No distress.  HENT:  Head: Normocephalic and atraumatic.  Neck: Normal range of motion. Neck supple.  Cardiovascular: Normal rate and regular rhythm.  Exam reveals no gallop and no friction rub.   No murmur heard. Pulmonary/Chest: Effort normal and breath sounds normal. No respiratory distress. She has no wheezes.  Abdominal: Soft. Bowel sounds are normal. She exhibits no distension. There is no tenderness.  Musculoskeletal: Normal range of motion.  Neurological: She is alert and oriented to person, place, and time.  Skin: Skin is warm and dry. She is not diaphoretic.    ED Course  Procedures (including critical care  time)  Labs Reviewed - No data to display No results found.   No diagnosis found.    MDM  The patient presents with shortness of breath after an episode of gerd in the night.  She was feeling somewhat better when she got here.  She was given a neb and a chest xray was performed which did not show any acute abnormality.  After 2 hours of observation, the sats are stable and she is in no respiratory distress.  She will be discharged to home, to return prn if she worsens.          Geoffery Lyons, MD 10/14/11 0900

## 2011-10-14 NOTE — Telephone Encounter (Signed)
She definitely does have reflux - we have talked about this and tried to deal w this before. We can arrange for nebulizer for her through DME, albuterol 2.5mg  nebs q4h prn SOB

## 2011-10-14 NOTE — Telephone Encounter (Signed)
PT seen in ER this am for asthma flare.  Pt was told that she had reflux.  Copy of reflux diet was left at front desk for pt to pick up.  Also , pt thinks that she may benefit from having a neb machine at home because she did not respond to Proair at home this am so went to ER and had positive response to Neb tx there.  Please advise if pt is ok to have neb machine with meds.  Okay to leave message with pt. Last ov : 10-05-11    Next ov: None scheduled

## 2011-10-27 ENCOUNTER — Telehealth: Payer: Self-pay | Admitting: Emergency Medicine

## 2011-10-27 MED ORDER — BUDESONIDE-FORMOTEROL FUMARATE 80-4.5 MCG/ACT IN AERO: 2.0000 | INHALATION_SPRAY | Freq: Two times a day (BID) | RESPIRATORY_TRACT | Status: DC

## 2011-10-27 NOTE — Telephone Encounter (Signed)
Ok to leave a msg for her explaining

## 2011-10-27 NOTE — Telephone Encounter (Signed)
Sample at front.pt is aware.Jaclyn Mitchell, CMA

## 2011-12-15 ENCOUNTER — Telehealth: Payer: Self-pay | Admitting: Emergency Medicine

## 2011-12-15 NOTE — Telephone Encounter (Signed)
i spoke with pt and scheduled her to come in and see RB 12/16/10 at 2:00 for visit. Nothing further was needed

## 2011-12-15 NOTE — Telephone Encounter (Signed)
Patient was wanting to cx appt for 12/16/11 2pm with [place]. After talking with patient about her increased symptoms and having to use neb tx more frequently patient decided to keep appt out of importance of following up with her physician for the best of care. Patient was wanting to double up on antacids that she now takes 1 QD to see if this would help with her symptoms of increased asthma flares---pt advised to hold off on increasing antacids until speaking with RB tomorrow at her appt.   Message to be sent to RB for FYI and signed off.

## 2011-12-16 ENCOUNTER — Encounter: Payer: Self-pay | Admitting: Emergency Medicine

## 2011-12-16 ENCOUNTER — Ambulatory Visit (INDEPENDENT_AMBULATORY_CARE_PROVIDER_SITE_OTHER): Payer: Self-pay | Admitting: Emergency Medicine

## 2011-12-16 VITALS — BP 120/70 | HR 97 | Temp 98.0°F | Ht 63.0 in | Wt 200.8 lb

## 2011-12-16 DIAGNOSIS — J45909 Unspecified asthma, uncomplicated: Secondary | ICD-10-CM

## 2011-12-16 NOTE — Progress Notes (Signed)
Subjective:    Patient ID: Jaclyn Mitchell, female    DOB: 02-Oct-1985, 26 y.o.   MRN: [id number]  HPI 26 yo woman, never smoker, dx with asthma as a child. She had to use SABA as a teen when she played softball. Mild persistent asthma.   ROV 10/28/09 -- Returns for asthma. Last time we planned to pre-treat exercise and sports. Developed some URI symptoms about 3 weeks ago, no real fever nasal drainage etc.  Can wake her from sleep and respondes to SABA. Had one episode where she had trouble playing softball even after pre-treating.   ROV 12/05/09 -- f/u for her asthma. Last time we started QVAR 80. She has been taking in morning, not at night because she was having chest pain, burning, ? GERD. Using QVAR in the am has decreased her SABA use. She feels better on it.   March 06, 2010 --Presents for an acute office visit. Complains of intermittent burning in chest x 2 days, and states noticed tachycardia w/ rest x1day. Had severe episode of GERD w/suspected aspiration pneumonitis w/ admission in 08/2009. She says she has on/off burning in chest for last 4 months. Taking Prilosec once daily .Has had more intermittent wheezing and used her proair 3 x this past week. This am ate  sausage biscuit this morning with increased burning in chest. Denies orthopnea, hemoptysis, fever, n/v/d, edema, headache, calf pain, syncope , abdominal pain or bloody symptoms.   ROV 04/04/10 -- excercise induced asthma, has had some persistant symptoms so we started QVAR. She had chest discomfort with the QVAR, increased prilosec but it didn't help. Sounds like the QVAR is causing GERD. No evidence thrush. Stopped QVAR 2 weeks ago and pain resolved. Has been averaging albuterol about two times a day.   ROV 05/09/10 -- asthma, triggers include exercise. Last time we changed to Symbicort as she was having chest pain with QVAR. ? GERD sx, taking omeprazole qd. Change in the weather bothers her, on a few occasions she has had to use  SABA. Having some trouble w allergies since last visit. The GERD seems to be better with Symbicort.   ROV 06/16/10 -- hx asthma, mild persistent with exercise as a trigger, also allergic rhinitis. She returns today for f/u. Since we changed QVAR to Symbicort she has done very well. She has not been needing her SABA, in fact did not take it before a softball game recently and she did well - no breathing problems. She has not been taking her omeprazole regularly.   ROV 06/25/10 -- asthma, often exercise related. Also with allergies and GERD as triggers. Remains on Symbicort bid. She presents with a week of cough, burning in chest last week x 1, led to cough that hasn't gone away. She upped omeprazole to bid with resolution of the GERD sx. Propped her head up in bed.  No wheezing. Has not used her SABA, hasn't needed it.   Acute OV 08/20/2010  Pt complains of  Non-productive cough x 1 week. No wheezing or chest tightness. Did well w/ no SABA use for last 3 months.  Cough came back with recent change in weather and worse with high humidity . Working in outside camp.  She complains cough is keeping her up at night. No discolored mucus. NO chest pain. No overt reflux.  Tessalon not helping. Using otc cough meds.   ROV 11/03/10 -- asthma with exercise component, probably mild persistent by criteria. She has had some difficulty with  managing both the wheeze and cough. She has discovered that carbonated drinks, orange juice are severe exacerbators. Currently on omeprazole qd. Not taking allergy meds at this time. Symbicort bid, ran out over the weekend.   ROV 01/15/11 -- asthma, exercise component, also exacerbated by her GERD. Returns today with about 1 week of increased SOB, cough, chest burning - she had some spicy food Friday, has been taking more carbonated drinks. Having heartburn - in increase prilosec to bid x 1 day. Has used her SABA 7 times over the weekend. She may have ben coming down with a URI as  well. Taking Symbicort bid.   ROV 03/30/11 -- asthma with exercise as trigger, also GERD, sometimes allergies. Her GERD has been labile, also has had some breathing difficulty associated with weather change. Also triggered recently due to perfume. She is on omeprazole every day and prevacid qod. No other exacerbations since last time. Wants to know if she can play volleyball and softball.   ROV 10/05/11 -- asthma with exercise as trigger, also GERD, sometimes allergies. Since last time, we changed temporarily to Advair because she was worried that Symbicort was bothering her vision. Now back to Symbicort. Not currently taking any GERD meds, only taking Symbicort once a day on most days. Using her albuterol about daily. She is having occasional dyspnea, wheeze, about every day. She gave up softball because she is frequently out of town.   ROV 12/16/11 -- asthma with exercise as trigger, also GERD, sometimes allergies. She has been fairly stable since last time. She has had some trouble with smoke exposure. Yesterday had an episode of CP (? GERD), had to use her SABA 3x yesterday. She feels better today.     Objective:  Physical Exam Filed Vitals:   12/16/11 1354  BP: 120/70  Pulse: 97  Temp: 98 F (36.7 C)   Gen: Pleasant, well-nourished, in no distress,  normal affect  ENT: No lesions,  mouth clear,  oropharynx clear, no postnasal drip, some hoarse voice.   Lungs: No use of accessory muscles, no dullness to percussion, clear without rales or rhonchi  Cardiovascular: RRR, heart sounds normal, no murmur or gallops, no peripheral edema  Musculoskeletal: No deformities, no cyanosis or clubbing  Neuro: alert, non focal  Skin: Warm, no lesions or rashes   No problem-specific assessment & plan notes found for this encounter.

## 2011-12-16 NOTE — Patient Instructions (Addendum)
Please continue to use your symbicort twice a day Use your albuterol 2 puffs as needed Continue your omeprazole daily Try to avoid exposure to smoke, perfumes.  Follow with Dr Adelei Scobey in 6 months or sooner if you have any problems 

## 2011-12-16 NOTE — Addendum Note (Signed)
Addended by: Orma Flaming D on: 12/16/2011 03:07 PM   Modules accepted: Orders

## 2011-12-16 NOTE — Assessment & Plan Note (Signed)
Please continue to use your symbicort twice a day Use your albuterol 2 puffs as needed Continue your omeprazole daily Try to avoid exposure to smoke, perfumes.  Follow with Dr Delton Coombes in 6 months or sooner if you have any problems

## 2011-12-18 ENCOUNTER — Telehealth: Payer: Self-pay | Admitting: Emergency Medicine

## 2011-12-18 NOTE — Telephone Encounter (Signed)
Called and spoke with pt and she stated that she was seen by RB on 11-20 for reflux and asthma.  She stated that she is taking the omeprazole and RB told her to take the tums if needed as well.  Pt is requesting any other recs from RB.  She stated that yesterday she felt like she had a rubber band around her chest and someone was pulling on it to make it tight.  RB please advise of any of recs for this pt.  Thanks  Allergies  Allergen Reactions  . Ceftriaxone Sodium     REACTION: given via IV during hosp stay 08/2009 - pt states caused rash  . Penicillins Other (See Comments)    unknown

## 2011-12-18 NOTE — Telephone Encounter (Signed)
I do think that it is possible that this is her asthma. She needs to use her rescue inhaler when she feels the tightness so that we can determine whether this is truly her asthma (it should respond to albuterol). If she develops more frequent symptoms, or if they change/worsen, then we may need to do more with meds for her GERD and/or asthma.

## 2011-12-18 NOTE — Telephone Encounter (Signed)
Jaclyn Mitchell., MD 12/18/2011 2:58 PM Signed  I do think that it is possible that this is her asthma. She needs to use her rescue inhaler when she feels the tightness so that we can determine whether this is truly her asthma (it should respond to albuterol). If she develops more frequent symptoms, or if they change/worsen, then we may need to do more with meds for her GERD and/or asthma.   -------  i called and left this detailed message AGAIN on pt VM advised pt tcb if she did not understand

## 2011-12-18 NOTE — Telephone Encounter (Signed)
Left detailed message on pt VM as advised by pt. Advised her to call back if she had any questions

## 2011-12-18 NOTE — Telephone Encounter (Signed)
Patient requesting for nurse to leave on message when calling back, because she will be at work.

## 2011-12-28 ENCOUNTER — Telehealth: Payer: Self-pay | Admitting: Emergency Medicine

## 2011-12-28 MED ORDER — BUDESONIDE-FORMOTEROL FUMARATE 80-4.5 MCG/ACT IN AERO: 2.0000 | INHALATION_SPRAY | Freq: Two times a day (BID) | RESPIRATORY_TRACT | Status: DC

## 2011-12-28 NOTE — Telephone Encounter (Signed)
symbicort x2  Pt aware to pick up

## 2012-01-08 ENCOUNTER — Telehealth: Payer: Self-pay | Admitting: Emergency Medicine

## 2012-01-08 NOTE — Telephone Encounter (Signed)
D/w Dr Delton Coombes:  Double up omeprazole to 40mg  morning on empty stomach. Suspect GERD. IF getting worse, go to ER.

## 2012-01-08 NOTE — Telephone Encounter (Signed)
Called and spoke with pt and she stated that she has been having right sided chest pain x 3 days.  She has been using the antacid per RB recs but pt stated that this pain is getting worse and the antacid is not helping her.  She has no other symptoms with her asthma, etc.  She stated that this is a stabbing/throbbing pain.   Pt is requesting recs but RB is not in the office today.  Please advise. Thanks   Allergies  Allergen Reactions  . Ceftriaxone Sodium     REACTION: given via IV during hosp stay 08/2009 - pt states caused rash  . Penicillins Other (See Comments)    unknown

## 2012-01-08 NOTE — Telephone Encounter (Signed)
Called and spoke with pt and she is aware of RB recs to increase the omeprazole to 40 mg every morning on an empty stomach.  Pt is aware if she is getting worse to go to the ER.  Pt voiced her understanding of these recs and nothing further is needed.

## 2012-01-11 ENCOUNTER — Telehealth: Payer: Self-pay | Admitting: Emergency Medicine

## 2012-01-11 NOTE — Telephone Encounter (Signed)
Pt last seen 12/16/11 and advised the following  Patient Instructions     Please continue to use your symbicort twice a day  Use your albuterol 2 puffs as needed  Continue your omeprazole daily  Try to avoid exposure to smoke, perfumes.  Follow with Dr Delton Coombes in 6 months or sooner if you have any problems   LMTCB

## 2012-01-12 NOTE — Telephone Encounter (Signed)
RAMASWAMY,MURALI, MD 01/08/2012 1:35 PM Signed  D/w Dr Delton Coombes:  Double up omeprazole to 40mg  morning on empty stomach. Suspect GERD. IF getting worse, go to ER.  --------  I spoke with pt and made her aware of RB recs. She voiced her understanding. Nothing further was needed

## 2012-01-12 NOTE — Telephone Encounter (Signed)
Pt returned call.  Advised that instructions listed below was for pt to continue taking Would like to know if she is to continue taking omeprazole daily.  Pt states she has another question regarding her antacids as discussed with RB previously.  Is she suppose to continue taking & watching her diet or what.  Antionette Fairy

## 2012-03-11 ENCOUNTER — Other Ambulatory Visit: Payer: Self-pay | Admitting: Emergency Medicine

## 2012-03-22 ENCOUNTER — Telehealth: Payer: Self-pay | Admitting: Emergency Medicine

## 2012-03-22 NOTE — Telephone Encounter (Signed)
Spoke with pt She states that for the past 2 days having discomfort in her chest She states that the pain in intermittent and is neither sharp or dull in nature Pain starts in the center of her chest and radiates down her rt arm and er rt leg She denies any SOB, nausea, diaphoresis, palpitations or other complaints Denies any changes in her diet and still taking omeprazole daily She declined ov today, states has to go to work  She states does not really have a PCP at this time and requesting recs from RB I advised will forward msg to RB and will call her back with recs, but she should seek emergent care in the meantime should symptoms persist or worsen Please advise, thanks!

## 2012-03-22 NOTE — Telephone Encounter (Signed)
lmtcb x1 gerd diet placed in mail

## 2012-03-22 NOTE — Telephone Encounter (Signed)
Increase omeprazole to bid for 5 days, then go back to daily Call us if not improving GERD diet

## 2012-03-22 NOTE — Telephone Encounter (Signed)
The pt is aware of RB recs and will call if no improvement.

## 2012-03-22 NOTE — Telephone Encounter (Signed)
lmomtcb  

## 2012-04-04 ENCOUNTER — Telehealth: Payer: Self-pay | Admitting: Emergency Medicine

## 2012-04-04 MED ORDER — BUDESONIDE-FORMOTEROL FUMARATE 80-4.5 MCG/ACT IN AERO: 2.0000 | INHALATION_SPRAY | Freq: Two times a day (BID) | RESPIRATORY_TRACT | Status: DC

## 2012-04-04 NOTE — Telephone Encounter (Signed)
lmtcb x1 for pt. 

## 2012-04-04 NOTE — Telephone Encounter (Signed)
Spoke with pt.  She is requesting sample of symbicort 80.  States she has a rx but will not be able to afford it until she gets paid.  I have placed 2 samples at the front for pick up.  Pt aware and voiced no further questions or concerns at this time.

## 2012-04-04 NOTE — Telephone Encounter (Signed)
Pt returned call. Jaclyn Mitchell  

## 2012-04-15 ENCOUNTER — Ambulatory Visit (INDEPENDENT_AMBULATORY_CARE_PROVIDER_SITE_OTHER): Payer: Self-pay | Admitting: Emergency Medicine

## 2012-04-15 ENCOUNTER — Encounter: Payer: Self-pay | Admitting: Emergency Medicine

## 2012-04-15 VITALS — BP 122/80 | HR 107 | Temp 97.7°F | Ht 62.0 in | Wt 215.2 lb

## 2012-04-15 DIAGNOSIS — J45909 Unspecified asthma, uncomplicated: Secondary | ICD-10-CM

## 2012-04-15 DIAGNOSIS — K219 Gastro-esophageal reflux disease without esophagitis: Secondary | ICD-10-CM

## 2012-04-15 NOTE — Patient Instructions (Addendum)
Please continue your Symbicort twice a day Use albuterol if needed Continue your omeprazole. Call our office if you have more chest pain - you will need to se a gastroenterologist if this happens.  Follow with Dr Delton Coombes in 6 months or sooner if you have any problems

## 2012-04-15 NOTE — Assessment & Plan Note (Signed)
Has been severe at times. Remains on omeprazole.  - if severe or sustained pain happens again then she needs to see GI  - continue omeprazole.

## 2012-04-15 NOTE — Progress Notes (Signed)
Subjective:    Patient ID: Jaclyn Mitchell, female    DOB: 1985-11-04, 27 y.o.   MRN: [id number]  HPI 27 yo woman, never smoker, dx with asthma as a child. She had to use SABA as a teen when she played softball. Mild persistent asthma.   ROV 10/28/09 -- Returns for asthma. Last time we planned to pre-treat exercise and sports. Developed some URI symptoms about 3 weeks ago, no real fever nasal drainage etc.  Can wake her from sleep and respondes to SABA. Had one episode where she had trouble playing softball even after pre-treating.   ROV 12/05/09 -- f/u for her asthma. Last time we started QVAR 80. She has been taking in morning, not at night because she was having chest pain, burning, ? GERD. Using QVAR in the am has decreased her SABA use. She feels better on it.   March 06, 2010 --Presents for an acute office visit. Complains of intermittent burning in chest x 2 days, and states noticed tachycardia w/ rest x1day. Had severe episode of GERD w/suspected aspiration pneumonitis w/ admission in 08/2009. She says she has on/off burning in chest for last 4 months. Taking Prilosec once daily .Has had more intermittent wheezing and used her proair 3 x this past week. This am ate  sausage biscuit this morning with increased burning in chest. Denies orthopnea, hemoptysis, fever, n/v/d, edema, headache, calf pain, syncope , abdominal pain or bloody symptoms.   ROV 04/04/10 -- excercise induced asthma, has had some persistant symptoms so we started QVAR. She had chest discomfort with the QVAR, increased prilosec but it didn't help. Sounds like the QVAR is causing GERD. No evidence thrush. Stopped QVAR 2 weeks ago and pain resolved. Has been averaging albuterol about two times a day.   ROV 05/09/10 -- asthma, triggers include exercise. Last time we changed to Symbicort as she was having chest pain with QVAR. ? GERD sx, taking omeprazole qd. Change in the weather bothers her, on a few occasions she has had to use  SABA. Having some trouble w allergies since last visit. The GERD seems to be better with Symbicort.   ROV 06/16/10 -- hx asthma, mild persistent with exercise as a trigger, also allergic rhinitis. She returns today for f/u. Since we changed QVAR to Symbicort she has done very well. She has not been needing her SABA, in fact did not take it before a softball game recently and she did well - no breathing problems. She has not been taking her omeprazole regularly.   ROV 06/25/10 -- asthma, often exercise related. Also with allergies and GERD as triggers. Remains on Symbicort bid. She presents with a week of cough, burning in chest last week x 1, led to cough that hasn't gone away. She upped omeprazole to bid with resolution of the GERD sx. Propped her head up in bed.  No wheezing. Has not used her SABA, hasn't needed it.   Acute OV 08/20/2010  Pt complains of  Non-productive cough x 1 week. No wheezing or chest tightness. Did well w/ no SABA use for last 3 months.  Cough came back with recent change in weather and worse with high humidity . Working in outside camp.  She complains cough is keeping her up at night. No discolored mucus. NO chest pain. No overt reflux.  Tessalon not helping. Using otc cough meds.   ROV 11/03/10 -- asthma with exercise component, probably mild persistent by criteria. She has had some difficulty with  managing both the wheeze and cough. She has discovered that carbonated drinks, orange juice are severe exacerbators. Currently on omeprazole qd. Not taking allergy meds at this time. Symbicort bid, ran out over the weekend.   ROV 01/15/11 -- asthma, exercise component, also exacerbated by her GERD. Returns today with about 1 week of increased SOB, cough, chest burning - she had some spicy food Friday, has been taking more carbonated drinks. Having heartburn - in increase prilosec to bid x 1 day. Has used her SABA 7 times over the weekend. She may have ben coming down with a URI as  well. Taking Symbicort bid.   ROV 03/30/11 -- asthma with exercise as trigger, also GERD, sometimes allergies. Her GERD has been labile, also has had some breathing difficulty associated with weather change. Also triggered recently due to perfume. She is on omeprazole every day and prevacid qod. No other exacerbations since last time. Wants to know if she can play volleyball and softball.   ROV 10/05/11 -- asthma with exercise as trigger, also GERD, sometimes allergies. Since last time, we changed temporarily to Advair because she was worried that Symbicort was bothering her vision. Now back to Symbicort. Not currently taking any GERD meds, only taking Symbicort once a day on most days. Using her albuterol about daily. She is having occasional dyspnea, wheeze, about every day. She gave up softball because she is frequently out of town.   ROV 12/16/11 -- asthma with exercise as trigger, also GERD, sometimes allergies. She has been fairly stable since last time. She has had some trouble with smoke exposure. Yesterday had an episode of CP (? GERD), had to use her SABA 3x yesterday. She feels better today.   ROV 04/15/12 -- asthma with exercise as trigger, also GERD, sometimes allergies. Her GERD has been difficult to manage, but she is now back on omeprazole qd. Her breathing has been doing well. She remains on symbicort, rare albuterol use.     Objective:  Physical Exam Filed Vitals:   04/15/12 1447  BP: 122/80  Pulse: 107  Temp: 97.7 F (36.5 C)   Gen: Pleasant, well-nourished, in no distress,  normal affect  ENT: No lesions,  mouth clear,  oropharynx clear, no postnasal drip, some hoarse voice.   Lungs: No use of accessory muscles, no dullness to percussion, clear without rales or rhonchi  Cardiovascular: RRR, heart sounds normal, no murmur or gallops, no peripheral edema  Musculoskeletal: No deformities, no cyanosis or clubbing  Neuro: alert, non focal  Skin: Warm, no lesions or  rashes   G E R D Has been severe at times. Remains on omeprazole.  - if severe or sustained pain happens again then she needs to see GI  - continue omeprazole.   ASTHMA - continue symbicort + SABA prn

## 2012-04-15 NOTE — Assessment & Plan Note (Signed)
-   continue symbicort - SABA prn 

## 2012-06-06 ENCOUNTER — Encounter: Payer: Self-pay | Admitting: Emergency Medicine

## 2012-06-06 ENCOUNTER — Telehealth: Payer: Self-pay | Admitting: Emergency Medicine

## 2012-06-06 NOTE — Telephone Encounter (Signed)
Letter written and faxed.

## 2012-06-06 NOTE — Telephone Encounter (Signed)
I spoke with Jaclyn Mitchell. She stated she needs a letter faxed to her boss Brielynn Sekula stating cigarette smoke can cause her asthma to flare up. She states her boss does not believe her. Fax # is [phone]. Please advise RB thanks

## 2012-06-07 NOTE — Telephone Encounter (Signed)
LMOM TCB x1 to inform pt letter was faxed (verified by Leotis Shames)

## 2012-06-07 NOTE — Telephone Encounter (Signed)
Pt returned call.  Advised pt that letter has been faxed.  Pt verbalized understanding & states nothing further needed a this time.  Jaclyn Mitchell

## 2012-07-01 ENCOUNTER — Telehealth: Payer: Self-pay | Admitting: Emergency Medicine

## 2012-07-01 MED ORDER — BUDESONIDE-FORMOTEROL FUMARATE 80-4.5 MCG/ACT IN AERO: 2.0000 | INHALATION_SPRAY | Freq: Two times a day (BID) | RESPIRATORY_TRACT | Status: DC

## 2012-07-01 NOTE — Telephone Encounter (Signed)
ATC Jennifer @ number provided No answer, LMOMTCB

## 2012-07-01 NOTE — Telephone Encounter (Signed)
Patient's mother returning call.

## 2012-07-01 NOTE — Telephone Encounter (Signed)
Spoke with patients mother, Patient needing sample symbicort Sample placed upfront for pick, Victorino Dike is aware and nothing further needed at this time

## 2012-07-27 ENCOUNTER — Telehealth: Payer: Self-pay | Admitting: Emergency Medicine

## 2012-07-27 NOTE — Telephone Encounter (Signed)
Spoke with patient c/o "heart burn like sx" states she took one omeprazole and it did not relieve her sx at all. Patient requesting to increase omeprazole to 40mg . Per RB this is ok Patient verbalized understanding and nothing further needed at this time.

## 2012-09-01 ENCOUNTER — Telehealth: Payer: Self-pay | Admitting: Emergency Medicine

## 2012-09-01 NOTE — Telephone Encounter (Signed)
Called and spoke with pt and she is aware of RB recs. She stated that she will stay on the omeprazole bid and try the tums as needed.  She is aware to try this for now and call back in a week or so for an update.  Pt voiced her understanding and nothing further is needed.

## 2012-09-01 NOTE — Telephone Encounter (Signed)
Pt returned call.  Holly D Pryor ° °

## 2012-09-01 NOTE — Telephone Encounter (Signed)
LMOM x 2 

## 2012-09-01 NOTE — Telephone Encounter (Signed)
LMTCB x1 for pt--VS has openings this afternoon to be seen

## 2012-09-01 NOTE — Telephone Encounter (Signed)
Pt returned call

## 2012-09-01 NOTE — Telephone Encounter (Signed)
I agree with increasing her omeprazole, but it will take a while for this to work. Continue the higher dose for the next week. She can try using some Tums prn or some Mylanta or maalox prn (it is OK to use these together temporarily). If the GERD continues we will need to consider changing or increasing her PPI.

## 2012-09-01 NOTE — Telephone Encounter (Signed)
Middle of chest to the right side of chest Radiates down right side rib cage and under arm Pt states that this has eased up some in the last little bit. Pt states that she has been taking breathing meds as prescribed.  Pt has increased her Omeprazole 20mg  to bid today...still no relief.   No appts in office available. Pt states that she is pretty sure this is her reflux acting up.  Dr Delton Coombes please advise any recs. Thanks.  Page has been sent  Jaclyn Mitchell, Jaclyn Mitchell 04-01-1985 MRN: [id number] Please advise ASAP

## 2012-09-23 ENCOUNTER — Telehealth: Payer: Self-pay | Admitting: Emergency Medicine

## 2012-09-23 NOTE — Telephone Encounter (Signed)
left messages to schedule follow up apt. no return call back. sent letter 09/23/12 °

## 2012-10-18 ENCOUNTER — Telehealth: Payer: Self-pay | Admitting: Emergency Medicine

## 2012-10-18 MED ORDER — BUDESONIDE-FORMOTEROL FUMARATE 80-4.5 MCG/ACT IN AERO: 2.0000 | INHALATION_SPRAY | Freq: Two times a day (BID) | RESPIRATORY_TRACT | Status: DC

## 2012-10-18 NOTE — Telephone Encounter (Signed)
Spoke with patients mother Advised sample would be placed upfront for pickup Verbalized understanding and nothing further needed at this time

## 2013-01-02 ENCOUNTER — Telehealth: Payer: Self-pay | Admitting: Emergency Medicine

## 2013-01-02 NOTE — Telephone Encounter (Signed)
LMTCB

## 2013-01-03 MED ORDER — BUDESONIDE-FORMOTEROL FUMARATE 80-4.5 MCG/ACT IN AERO: 2.0000 | INHALATION_SPRAY | Freq: Two times a day (BID) | RESPIRATORY_TRACT | Status: DC

## 2013-01-03 NOTE — Telephone Encounter (Signed)
lmomtcb x1 for patient and mother 1 sample placed for pick up.

## 2013-01-03 NOTE — Telephone Encounter (Signed)
Pt's mother returned call.  Advised that a sample has been left up front for pick up.  Jaclyn Mitchell

## 2013-02-27 ENCOUNTER — Telehealth: Payer: Self-pay | Admitting: Emergency Medicine

## 2013-02-27 NOTE — Telephone Encounter (Signed)
lmomtcb x1 for pt 

## 2013-02-28 NOTE — Telephone Encounter (Signed)
LMOMTCB x 1 

## 2013-02-28 NOTE — Telephone Encounter (Signed)
Spoke with pt and advised that sample of Symbicort was left at front desk for pick up.

## 2013-04-04 ENCOUNTER — Telehealth: Payer: Self-pay | Admitting: Emergency Medicine

## 2013-04-04 NOTE — Telephone Encounter (Signed)
Patient calling stating she is fixing to go to school. Please leave msg on phone.  [phone]

## 2013-04-04 NOTE — Telephone Encounter (Signed)
Have her stop the symbicort for now. Set up an OV so we can discuss. She should still use albuterol prn

## 2013-04-04 NOTE — Telephone Encounter (Signed)
Last OV 04-15-12, no pending appt. Pt states she has been itching all over and has a rash of little red bumps all over her body. She states she thinks this is from the symbicort because x 1 week she went the beach and did not have her symbicort for that time. She states she did not have any rash or itching the entire time she was off the symbicort. Once she returned home she started the symbicort again and immediately began itching and having the rash again. Pt states she has been taking benadryl without relief.  Please advise. Carron CurieJennifer Castillo, CMA Allergies  Allergen Reactions  . Ceftriaxone Sodium     REACTION: given via IV during hosp stay 08/2009 - pt states caused rash  . Penicillins Other (See Comments)    unknown

## 2013-04-04 NOTE — Telephone Encounter (Signed)
Advised pt of rec's per RB and adivsed needs OV set up asap.  Pt verbalized understanding and has no further questions

## 2013-04-04 NOTE — Telephone Encounter (Signed)
LMOM x 1 

## 2013-05-05 ENCOUNTER — Telehealth: Payer: Self-pay | Admitting: Emergency Medicine

## 2013-05-05 MED ORDER — BUDESONIDE-FORMOTEROL FUMARATE 80-4.5 MCG/ACT IN AERO: 2.0000 | INHALATION_SPRAY | Freq: Two times a day (BID) | RESPIRATORY_TRACT | Status: DC

## 2013-05-05 NOTE — Telephone Encounter (Signed)
lmomtcb x1--sample left for pick up.

## 2013-05-05 NOTE — Telephone Encounter (Signed)
Pt is aware.  Samples are ready for p/u per Lillia AbedLindsay.  Pt verbalized understanding & nothing further needed.    Antionette FairyHolly D Pryor

## 2013-06-20 ENCOUNTER — Other Ambulatory Visit: Payer: Self-pay | Admitting: Emergency Medicine

## 2013-06-22 ENCOUNTER — Telehealth: Payer: Self-pay | Admitting: Emergency Medicine

## 2013-06-22 NOTE — Telephone Encounter (Signed)
Pt returning call, if no answer pls leave msg.Caren Griffins

## 2013-06-22 NOTE — Telephone Encounter (Signed)
LMTC x 1 - Pt has not been seen since 03/2012 and no pending appts.

## 2013-06-22 NOTE — Telephone Encounter (Signed)
Per RB ok to leave sample of symbicort 80 at front desk for pt to pick up. Spoke with pt and she is aware of this and nothing further is needed.

## 2013-08-02 ENCOUNTER — Telehealth: Payer: Self-pay | Admitting: Emergency Medicine

## 2013-08-02 MED ORDER — BUDESONIDE-FORMOTEROL FUMARATE 80-4.5 MCG/ACT IN AERO
2.0000 | INHALATION_SPRAY | Freq: Two times a day (BID) | RESPIRATORY_TRACT | Status: DC
Start: 2013-08-02 — End: 2013-11-27

## 2013-08-02 NOTE — Telephone Encounter (Signed)
Spoke with pt mother. Aware samples left for pick up and pt needs to keep her pending appt with RB. Nothing further needed

## 2013-08-31 ENCOUNTER — Ambulatory Visit: Payer: Self-pay | Admitting: Emergency Medicine

## 2013-11-27 ENCOUNTER — Telehealth: Payer: Self-pay | Admitting: Emergency Medicine

## 2013-11-27 MED ORDER — BUDESONIDE-FORMOTEROL FUMARATE 80-4.5 MCG/ACT IN AERO: 2.0000 | INHALATION_SPRAY | Freq: Two times a day (BID) | RESPIRATORY_TRACT | Status: DC

## 2013-11-27 NOTE — Telephone Encounter (Signed)
Called and spoke to pt's mother. Pt is requesting symbicort 80 samples. We do not have any symbicort 80's. Pt is currently in the process [facility]. Pt was last seen by RB in 03/2012. Pt stated she will call back later this week to schedule appt but the reason she hasn't had f/u is d/t financial issues.  Advised the pt's mother I could send in rx for symbicort and request recs by RB to see if another medication could be used in substitution. Rx sent in to preferred pharmacy. Pt's mother verbalized understanding.   RB please advise.   Allergies  Allergen Reactions  . Ceftriaxone Sodium     REACTION: given via IV during hosp stay 08/2009 - pt states caused rash  . Penicillins Other (See Comments)    unknown

## 2013-11-27 NOTE — Telephone Encounter (Signed)
We may have discount cards?

## 2013-11-28 NOTE — Telephone Encounter (Signed)
We have a discount card.  Called to advise patient, Jaclyn Mitchell. 11/3

## 2013-11-28 NOTE — Telephone Encounter (Signed)
Called, spoke with pt - pt reports pharmacy added a savings discount to Symbicort but medication will still be $270.   Pt states she has no insurance and is in the process of [facility] but was advised this is only for MD bills, not to help with medications. A Free Trial Offer of Symbicort for Cash Paying Pts card left at front for pick up along with [employer] pt assistance application. Pt aware.  Pt understands to bring Pt assistance application back to office with documents once complete.

## 2013-11-28 NOTE — Telephone Encounter (Signed)
Returned call & states that even with discount card provided by the pharmacy, Rx is still too expensive.  Can be reached at [phone]. Antionette FairyHolly D Pryor

## 2014-03-26 ENCOUNTER — Telehealth: Payer: Self-pay | Admitting: Emergency Medicine

## 2014-03-26 NOTE — Telephone Encounter (Signed)
Left detailed message on voicemail asking patient to call back to discuss patient assistance program.  Awaiting call back from patient.

## 2014-03-27 NOTE — Telephone Encounter (Signed)
Pt is aware that there are patient assistance forms that can be filled out. She would like those mailed to her. They have been mailed out today.

## 2014-03-27 NOTE — Telephone Encounter (Signed)
lmtcb x2 

## 2014-03-30 ENCOUNTER — Other Ambulatory Visit: Payer: Self-pay | Admitting: Emergency Medicine

## 2014-03-30 ENCOUNTER — Telehealth: Payer: Self-pay | Admitting: Emergency Medicine

## 2014-03-30 NOTE — Telephone Encounter (Signed)
We do not have samples at this time. Pt is aware. Nothing further was needed. 

## 2014-06-04 ENCOUNTER — Telehealth: Payer: Self-pay | Admitting: Emergency Medicine

## 2014-06-04 NOTE — Telephone Encounter (Signed)
Yes this is ok. More important, will need to get her some follow up - can she go to [facility] and have her asthma managed there? She has been stable and I can be available for questions. She was always extremely reliable until the financial situation changed.

## 2014-06-04 NOTE — Telephone Encounter (Signed)
[phone] calling back

## 2014-06-04 NOTE — Telephone Encounter (Signed)
Patient requesting samples of Symbicort.  Patient has not been seen since 2014.  Patient says that she cannot afford to come in for an office visit, she does not have insurance and cannot pay out of pocket.  She says even if she is billed, she cannot afford it.  She says that she is completely out of medication and needs samples.   RB - ok to give samples?

## 2014-06-04 NOTE — Telephone Encounter (Signed)
LMTCB x 1 

## 2014-06-04 NOTE — Telephone Encounter (Signed)
lmtcb x1 for pt. 

## 2014-06-04 NOTE — Telephone Encounter (Signed)
Pt refused to sched appt and requested to speak to nurse instead

## 2014-06-05 NOTE — Telephone Encounter (Signed)
Called and spoke to pt. Informed her of the recs per RB. Pt verbalized understanding and stated [facility] but its only been a couple times and a while ago. [facility]. Pt aware samples of Symbicort have been left up front for pick up. Nothing further needed.

## 2014-08-19 ENCOUNTER — Other Ambulatory Visit: Payer: Self-pay | Admitting: Emergency Medicine

## 2014-08-22 ENCOUNTER — Telehealth: Payer: Self-pay | Admitting: Emergency Medicine

## 2014-08-22 NOTE — Telephone Encounter (Signed)
LMTCB

## 2014-08-23 MED ORDER — ALBUTEROL SULFATE (2.5 MG/3ML) 0.083% IN NEBU: INHALATION_SOLUTION | RESPIRATORY_TRACT | Status: AC

## 2014-08-23 NOTE — Telephone Encounter (Signed)
Left message on voicemail requesting patient to call back with more details on appt with Gastroenterologist and which prescription she needs for the weekend. Awaiting call back

## 2014-08-23 NOTE — Telephone Encounter (Signed)
Yes please arrange if possible

## 2014-08-23 NOTE — Telephone Encounter (Signed)
Patient says that she has an appointment with another provider on Monday, she said that she needs a refill on her neb and would like a sample of Symbicort to hold her for the weekend until she sees the new provider on Monday.\  RB - ok to give sample and send Neb med?

## 2014-08-23 NOTE — Telephone Encounter (Signed)
LMTCB x2  

## 2014-08-23 NOTE — Telephone Encounter (Signed)
[phone], requesting that we leave detailed message on her vm if she is not able to answer the phone because she is at work

## 2014-08-23 NOTE — Telephone Encounter (Signed)
Rx sent to pharmacy Sample placed at front for patient to pick up. Left detailed message on voicemail advising patient that sample is at front to pick up and Rx has been sent to pharmacy. Nothing further needed.

## 2014-09-07 ENCOUNTER — Telehealth: Payer: Self-pay | Admitting: Emergency Medicine

## 2014-09-07 NOTE — Telephone Encounter (Signed)
Do not see in chart where patient was told that we needed proof of appointment for Monday.   Last TE states:   Expand All Collapse All   Rx sent to pharmacy Sample placed at front for patient to pick up. Left detailed message on voicemail advising patient that sample is at front to pick up and Rx has been sent to pharmacy. Nothing further needed.       Left message for patient to call back.

## 2014-09-07 NOTE — Telephone Encounter (Signed)
Patient says that Dr. Delton Coombes told her that she needed to have proof that she saw another provider before he can give her anymore refills of Symbicort Patient says that she has the papers with her from her OV with the other provider, but it is a 35 minute drive to come by our office to drop them off and she wanted to make sure that he needed the papers before she drives here. She also wanted to know if Dr. Delton Coombes will send in a new prescription of Symbicort.  Dr. Delton Coombes, please advise.

## 2014-09-07 NOTE — Telephone Encounter (Signed)
Patient notified.  Nothing further needed. 

## 2014-09-07 NOTE — Telephone Encounter (Signed)
I think we confused her.  I don't need proof that she has seen anyone. She needs to get the Symbicort from whomever she is seeing since that provider is examining her, reviewing symptoms, changes, etc. If we are going to provide the med, then we need to see her and check on her. If she is going to another provider then that person needs to write her for the medication. I am happy to keep following her and managing her asthma meds if she wishes. I believe that cost, travel time, etc are what have required her to go elsewhere.

## 2014-09-07 NOTE — Telephone Encounter (Signed)
Patient is returning call.  [phone].

## 2014-12-28 ENCOUNTER — Other Ambulatory Visit: Payer: Self-pay | Admitting: Family Medicine

## 2014-12-28 ENCOUNTER — Ambulatory Visit
Admission: RE | Admit: 2014-12-28 | Discharge: 2014-12-28 | Disposition: A | Discharge status: Home / Self Care | Payer: Self-pay | Source: Ambulatory Visit | Attending: Family Medicine | Admitting: Family Medicine

## 2014-12-28 DIAGNOSIS — J4530 Mild persistent asthma, uncomplicated: Secondary | ICD-10-CM

## 2014-12-28 DIAGNOSIS — R0602 Shortness of breath: Secondary | ICD-10-CM

## 2014-12-28 DIAGNOSIS — R079 Chest pain, unspecified: Secondary | ICD-10-CM | POA: Insufficient documentation

## 2014-12-28 IMAGING — CR DG CHEST 2V
1 series · 2 of 2 positions shown · non-contrast
Comparison: [DATE].

CLINICAL DATA: Chest pain.

EXAM:
CHEST  2 VIEW

[Series 1: dg chest 2 view · 0.14mm/px · 2 of 2 slices shown]
[im 1/2]
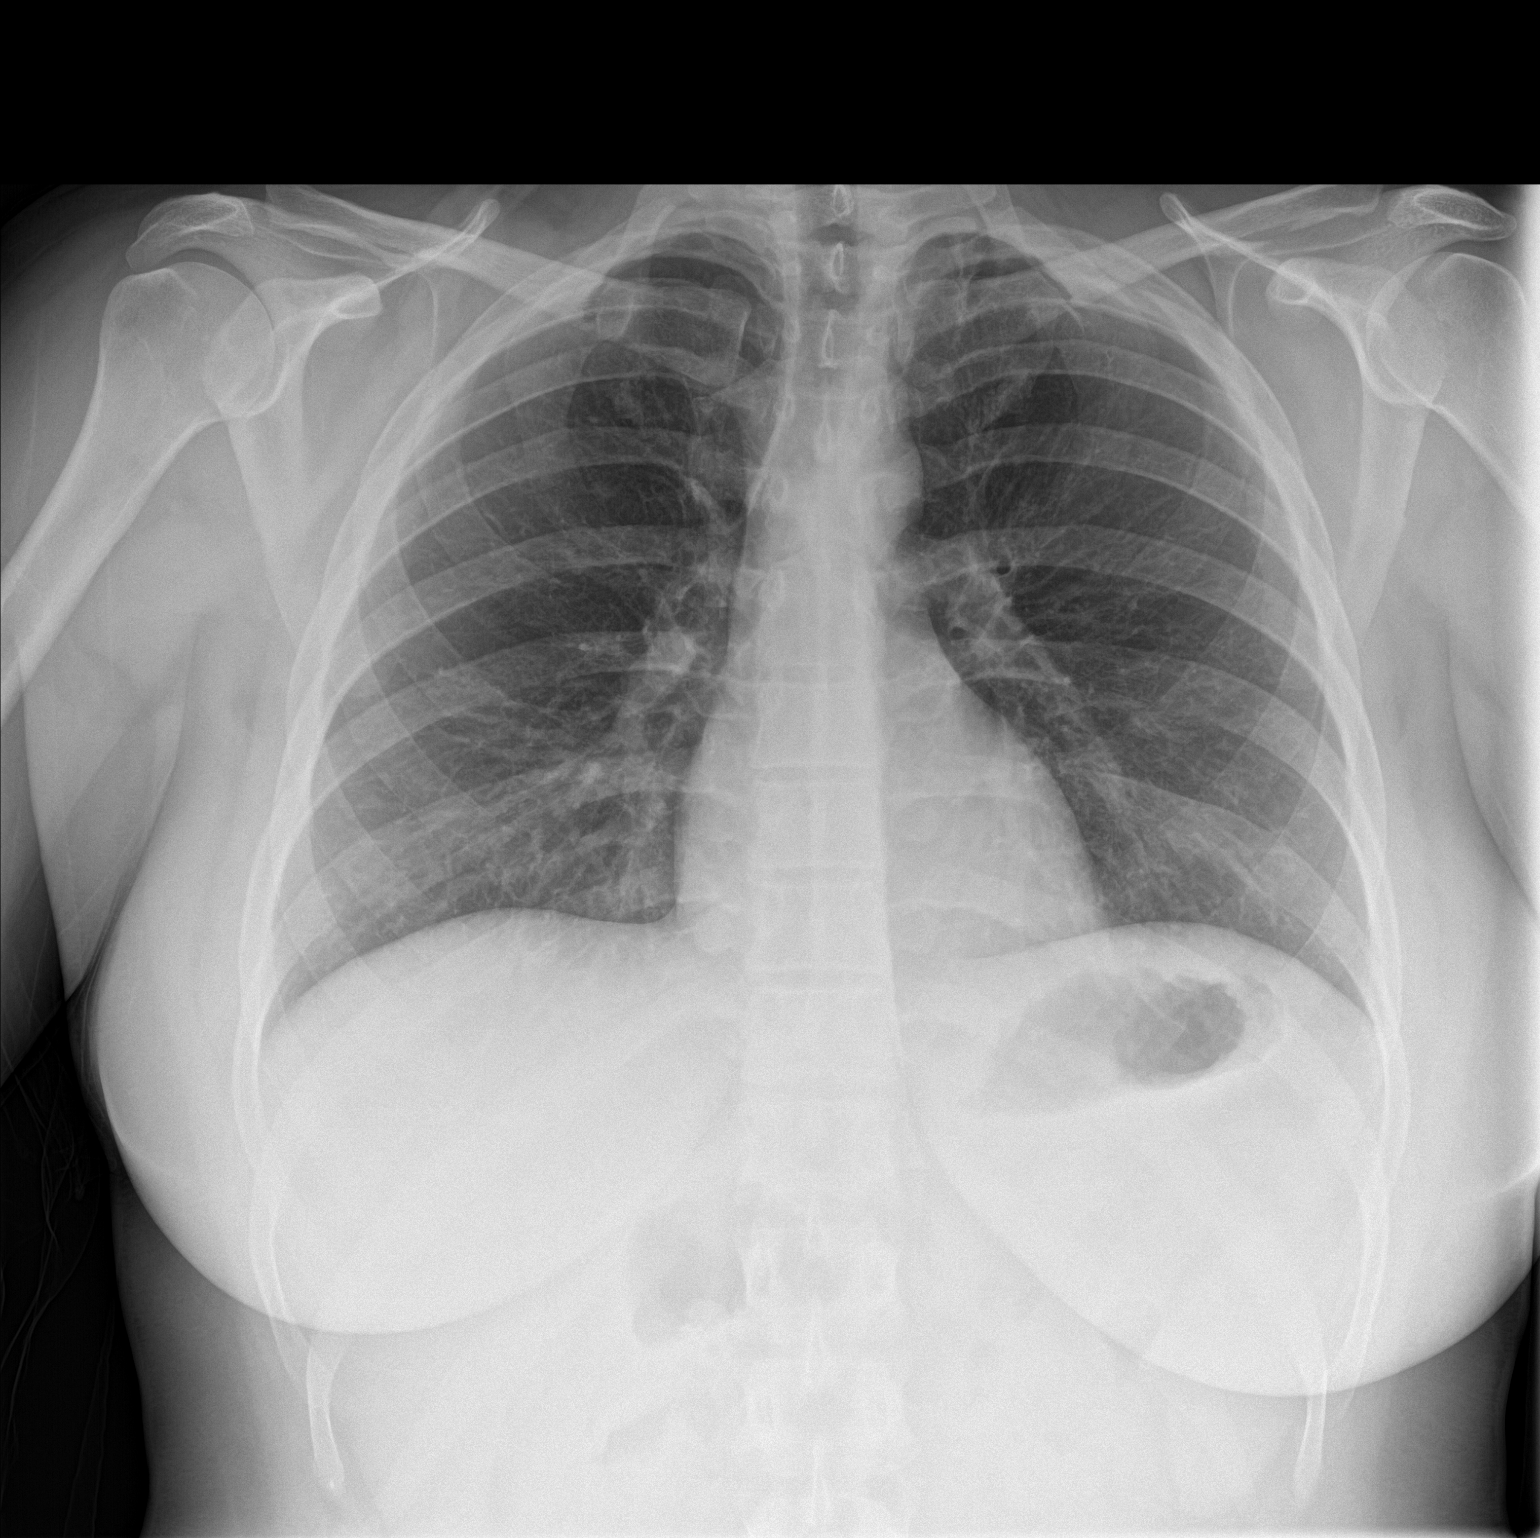
[im 2/2]
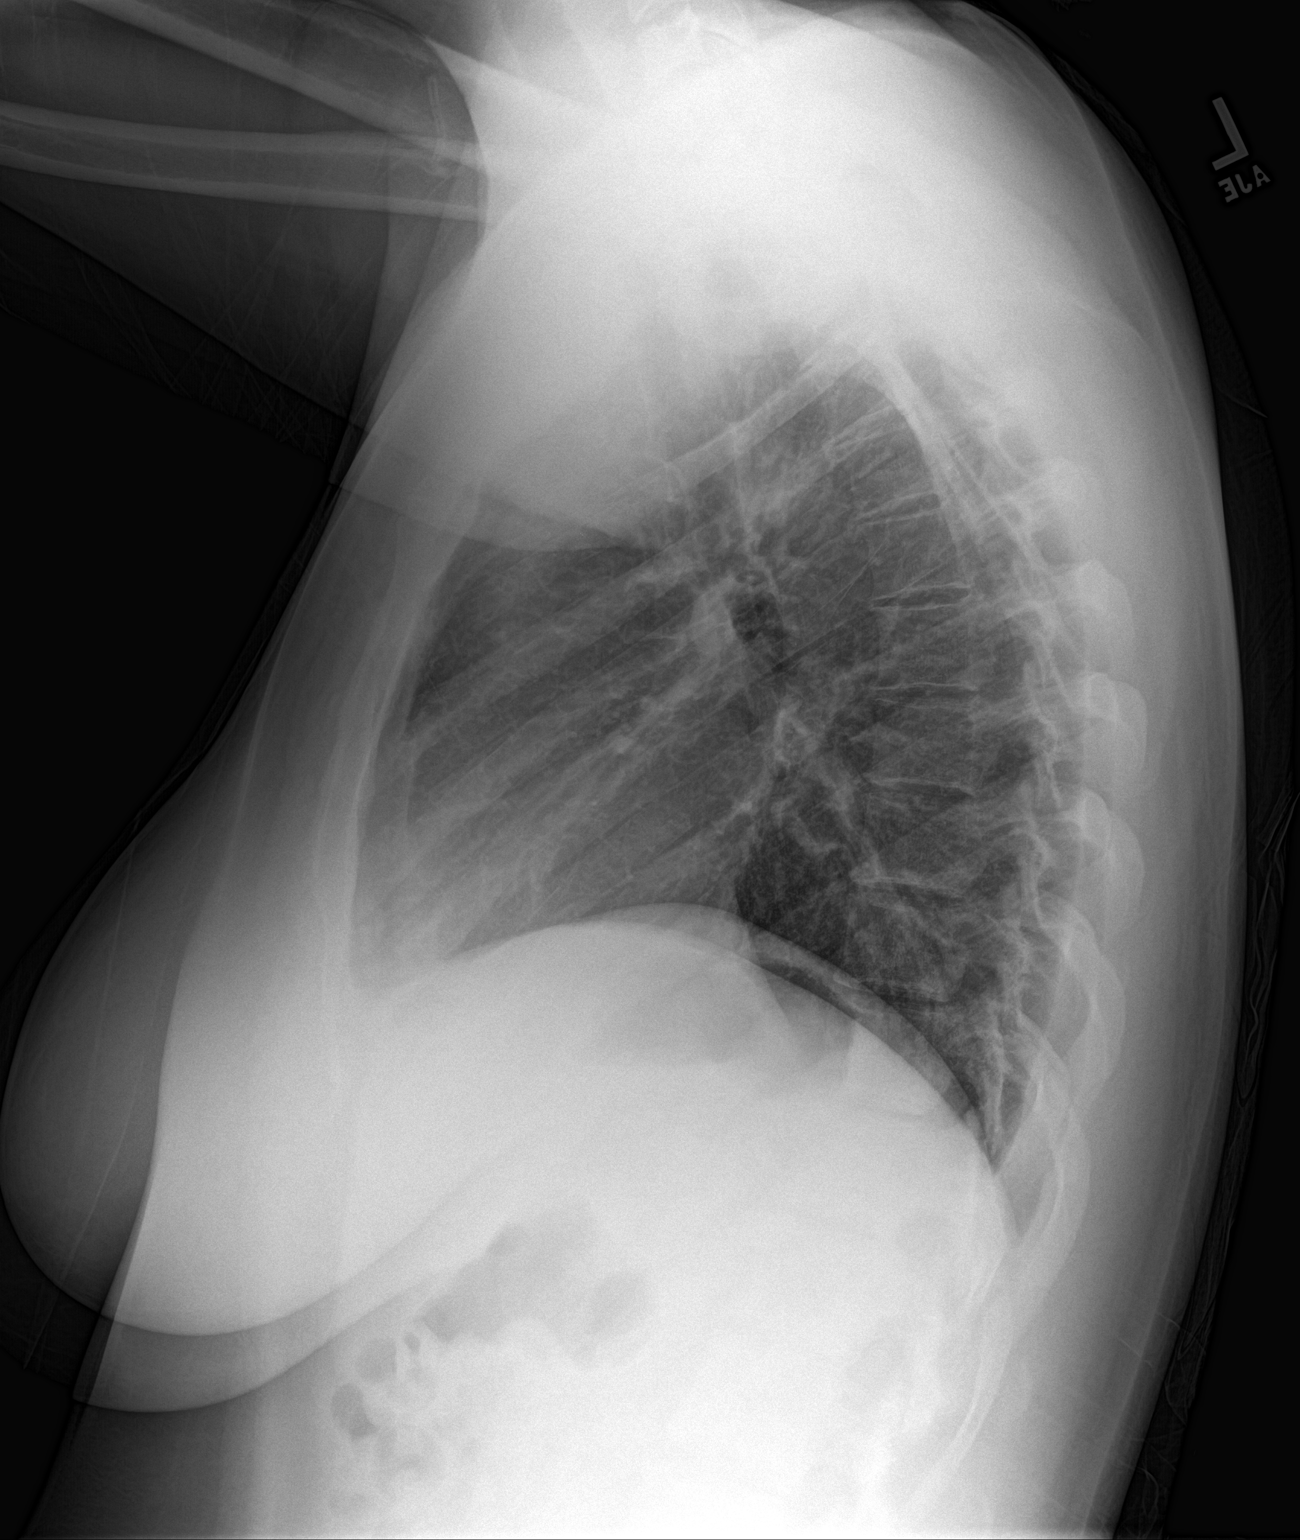

[2 of 2 positions shown; findings below may reference images not displayed]

FINDINGS: The heart size and mediastinal contours are within normal limits.
Both lungs are clear. The visualized skeletal structures are
unremarkable.
IMPRESSION: No active cardiopulmonary disease.

## 2015-08-20 ENCOUNTER — Telehealth: Payer: Self-pay | Admitting: Emergency Medicine

## 2015-08-20 NOTE — Telephone Encounter (Signed)
Called spoke with pt. She is requesting advice on the inhalers that she is on. She states that she no longer sees RB but would like his recs. I explained to her that in order for [place] to discuss this that she would need a ov with RB to reestablish care with our office. She refused stating that she no longer comes here because it is to far of a drive. She stated she will "figure it out" and hung up the phone. Nothing further needed at this time.

## 2016-10-28 DIAGNOSIS — W1801XA Striking against sports equipment with subsequent fall, initial encounter: Secondary | ICD-10-CM | POA: Insufficient documentation

## 2016-10-28 DIAGNOSIS — IMO0002 Reserved for concepts with insufficient information to code with codable children: Secondary | ICD-10-CM | POA: Insufficient documentation

## 2016-10-28 DIAGNOSIS — Y9364 Activity, baseball: Secondary | ICD-10-CM | POA: Insufficient documentation

## 2016-10-28 DIAGNOSIS — J9600 Acute respiratory failure, unspecified whether with hypoxia or hypercapnia: Secondary | ICD-10-CM | POA: Insufficient documentation

## 2019-03-23 ENCOUNTER — Ambulatory Visit: Payer: Self-pay | Attending: Internal Medicine

## 2019-03-23 ENCOUNTER — Ambulatory Visit: Payer: Self-pay

## 2019-03-23 DIAGNOSIS — Z23 Encounter for immunization: Secondary | ICD-10-CM | POA: Insufficient documentation

## 2019-03-23 NOTE — Progress Notes (Signed)
   Covid-19 Vaccination Clinic  Name:  Jaclyn Mitchell    MRN: [id number] DOB: November 30, 1985  03/23/2019  [name] was observed post Covid-19 immunization for 15 minutes without incidence. She was provided with Vaccine Information Sheet and instruction to access the V-Safe system.   [name] was instructed to call 911 with any severe reactions post vaccine: Marland Kitchen Difficulty breathing  . Swelling of your face and throat  . A fast heartbeat  . A bad rash all over your body  . Dizziness and weakness    Immunizations Administered    Name Date Dose VIS Date Route   Pfizer COVID-19 Vaccine 03/23/2019 12:18 PM 0.3 mL 01/06/2019 Intramuscular   Manufacturer: [employer], [employer]   Lot: [id number]   NDC: [address]-0

## 2019-03-25 ENCOUNTER — Ambulatory Visit: Payer: Self-pay

## 2019-04-18 ENCOUNTER — Ambulatory Visit: Payer: Self-pay | Attending: Internal Medicine

## 2019-04-18 DIAGNOSIS — Z23 Encounter for immunization: Secondary | ICD-10-CM

## 2019-04-18 NOTE — Progress Notes (Signed)
   Covid-19 Vaccination Clinic  Name:  Jaclyn Mitchell    MRN: [id number] DOB: [birth year]  04/18/2019  [name] was observed post Covid-19 immunization for 15 minutes without incident. She was provided with Vaccine Information Sheet and instruction to access the V-Safe system.   [name] was instructed to call 911 with any severe reactions post vaccine: Marland Kitchen Difficulty breathing  . Swelling of face and throat  . A fast heartbeat  . A bad rash all over body  . Dizziness and weakness   Immunizations Administered    Name Date Dose VIS Date Route   Pfizer COVID-19 Vaccine 04/18/2019  2:50 PM 0.3 mL 01/06/2019 Intramuscular   Manufacturer: [employer], [employer]   Lot: [id number]   NDC: [address]-2

## 2019-04-19 ENCOUNTER — Ambulatory Visit: Payer: Self-pay

## 2019-07-21 ENCOUNTER — Other Ambulatory Visit: Payer: Self-pay

## 2019-07-21 ENCOUNTER — Ambulatory Visit (INDEPENDENT_AMBULATORY_CARE_PROVIDER_SITE_OTHER): Payer: BC Managed Care – PPO

## 2019-07-21 ENCOUNTER — Other Ambulatory Visit: Payer: Self-pay | Admitting: Podiatry

## 2019-07-21 ENCOUNTER — Ambulatory Visit: Payer: BC Managed Care – PPO | Admitting: Podiatry

## 2019-07-21 ENCOUNTER — Encounter: Payer: Self-pay | Admitting: Podiatry

## 2019-07-21 DIAGNOSIS — I890 Lymphedema, not elsewhere classified: Secondary | ICD-10-CM | POA: Diagnosis not present

## 2019-07-21 DIAGNOSIS — M779 Enthesopathy, unspecified: Secondary | ICD-10-CM

## 2019-07-21 NOTE — Progress Notes (Signed)
   HPI: 34 y.o. female presenting today as a new patient for evaluation of swelling to the left lower extremity foot and ankle.  Patient states that since 2014, approximately 7 years ago, she has noticed swelling to her left lower extremity.  She denies injury.  She noticed that certain shoes are tight after wearing them all day.  By the end of the day she does have swelling.  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  She has tried switching shoes and soaking her foot and wearing compression socks.  She presents for further treatment and evaluation  Past Medical History:  Diagnosis Date  . Aspiration pneumonia   . Asthma   . GERD (gastroesophageal reflux disease)      Physical Exam: General: The patient is alert and oriented x3 in no acute distress.  Dermatology: Skin is warm, dry and supple bilateral lower extremities. Negative for open lesions or macerations.  Vascular: Palpable pedal pulses bilaterally.  There is some mild nonpitting edema noted to the left lower extremity findings consistent with lymphedema-chronic.  Capillary refill within normal limits.  Neurological: Epicritic and protective threshold grossly intact bilaterally.   Musculoskeletal Exam: Range of motion within normal limits to all pedal and ankle joints bilateral. Muscle strength 5/5 in all groups bilateral.   Radiographic Exam:  Normal osseous mineralization. Joint spaces preserved. No fracture/dislocation/boney destruction.    Assessment: 1.  Chronic lymphedema left lower extremity   Plan of Care:  1. Patient evaluated. X-Rays reviewed.  2.  Recommend that the patient continues compression hose daily.  Recommend Jobst compression stockings 3.  I explained that she may have chronic left lower extremity leg swelling which may be permanent. 4.  Return to clinic as needed      Felecia Shelling, DPM [facility]  Dr. Felecia Shelling, DPM    [address] [place], [place] [address]                Office [phone]  Fax [phone]

## 2019-07-21 NOTE — Patient Instructions (Signed)

## 2019-10-26 ENCOUNTER — Ambulatory Visit
Admission: RE | Admit: 2019-10-26 | Discharge: 2019-10-26 | Disposition: A | Discharge status: Home / Self Care | Payer: BC Managed Care – PPO | Source: Ambulatory Visit | Attending: Family Medicine | Admitting: Family Medicine

## 2019-10-26 ENCOUNTER — Other Ambulatory Visit: Payer: Self-pay | Admitting: Family Medicine

## 2019-10-26 ENCOUNTER — Other Ambulatory Visit: Payer: Self-pay

## 2019-10-26 DIAGNOSIS — R52 Pain, unspecified: Secondary | ICD-10-CM

## 2019-10-26 DIAGNOSIS — M79661 Pain in right lower leg: Secondary | ICD-10-CM

## 2019-10-26 DIAGNOSIS — S8991XA Unspecified injury of right lower leg, initial encounter: Secondary | ICD-10-CM

## 2019-10-26 IMAGING — US US EXTREM LOW VENOUS*R*
1 series · 13 of 24 positions shown · non-contrast
Comparison: None.

CLINICAL DATA: Lower extremity pain and edema

EXAM:
RIGHT LOWER EXTREMITY VENOUS DUPLEX ULTRASOUND
TECHNIQUE: Gray-scale sonography with graded compression, as well as color
Doppler and duplex ultrasound were performed to evaluate the right
lower extremity deep venous system from the level of the common
femoral vein and including the common femoral, femoral, profunda
femoral, popliteal and calf veins including the posterior tibial,
peroneal and gastrocnemius veins when visible. The superficial great
saphenous vein was also interrogated. Spectral Doppler was utilized
to evaluate flow at rest and with distal augmentation maneuvers in
the common femoral, femoral and popliteal veins.

[Series 1: us venous img lower uni right (dvt) · portal-venous · 13 of 33 slices shown]
[im 1/33]
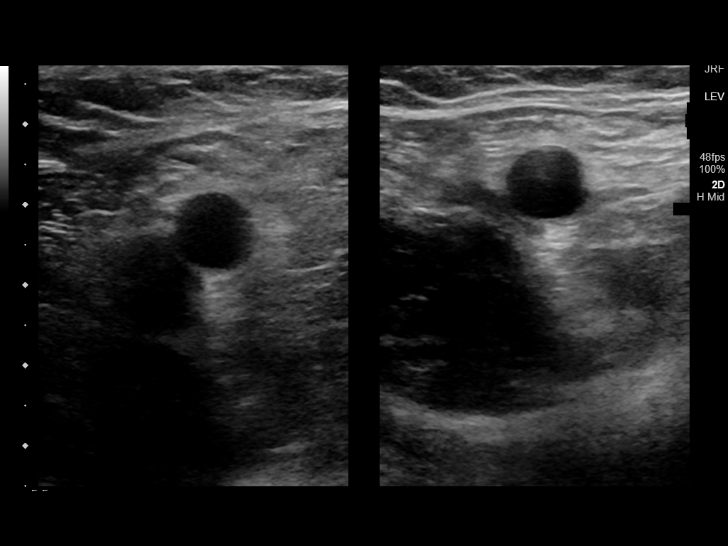
[im 3/33]
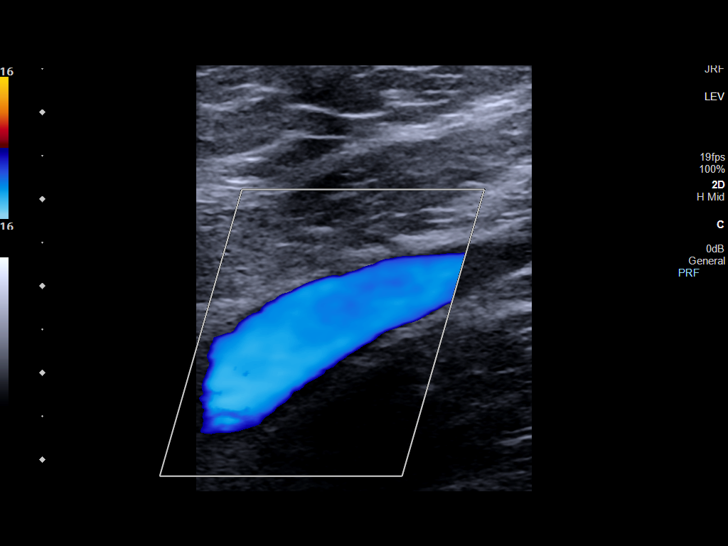
[im 6/33]
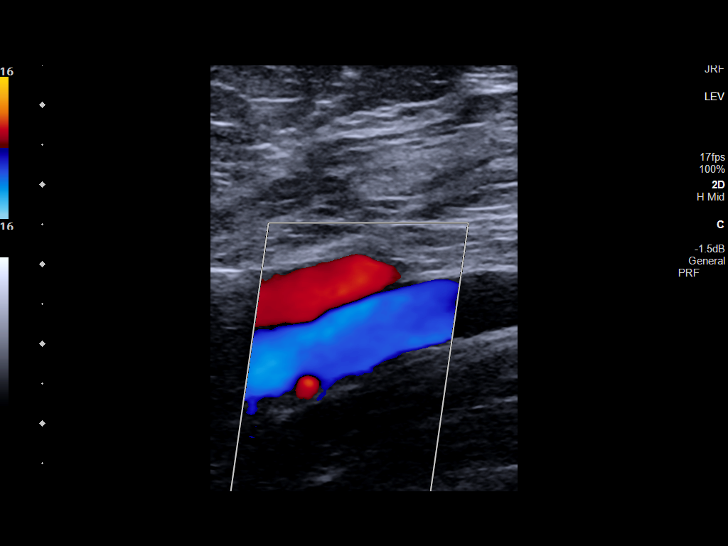
[im 9/33]
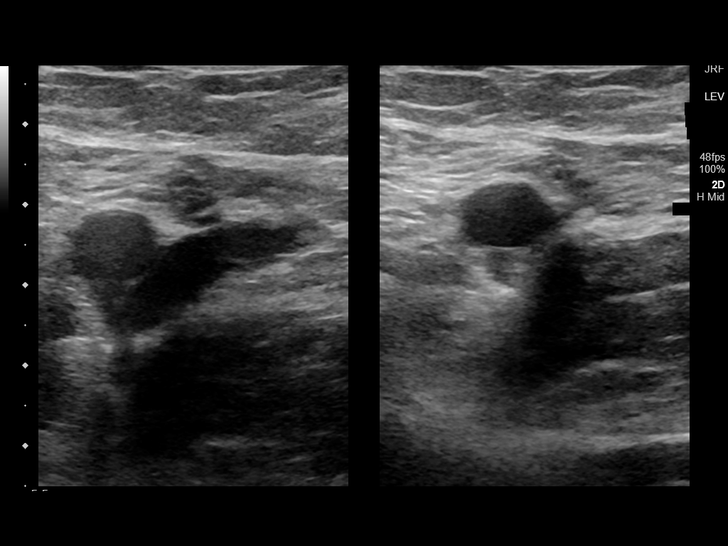
[im 12/33]
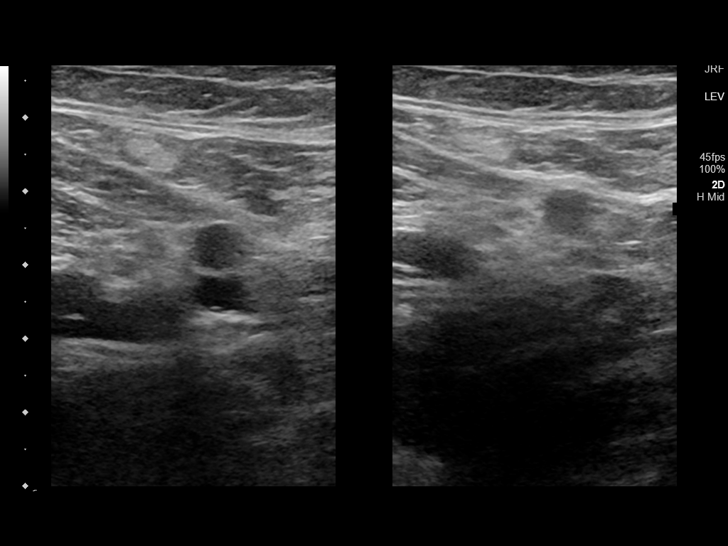
[im 14/33]
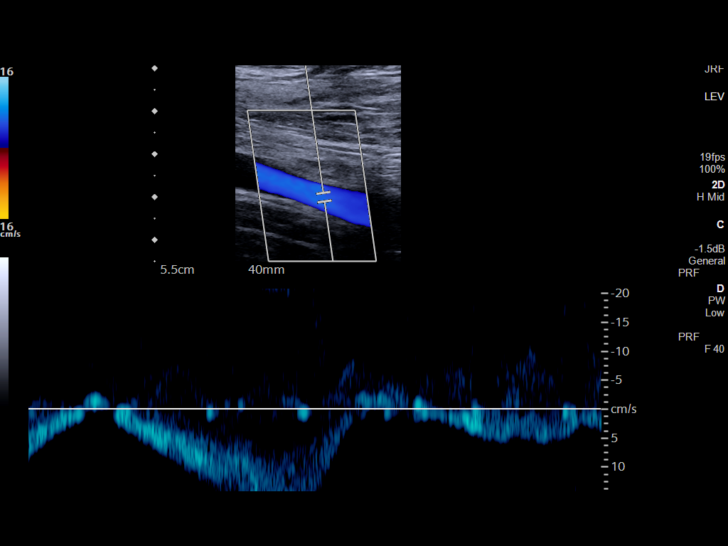
[im 17/33]
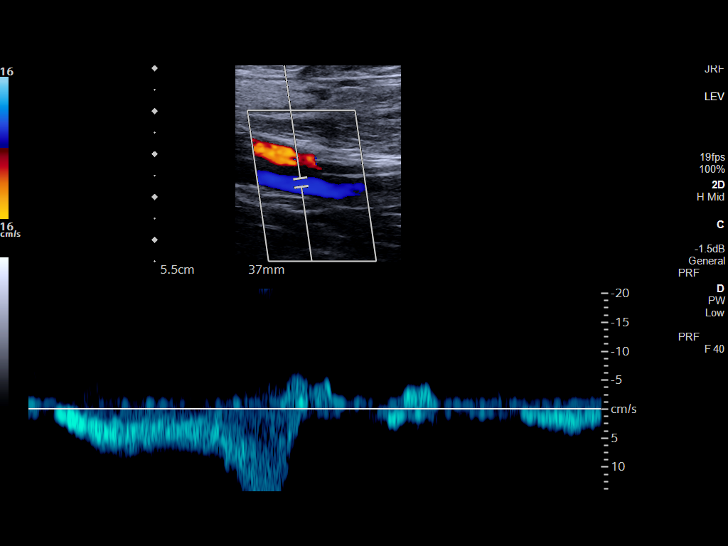
[im 19/33]
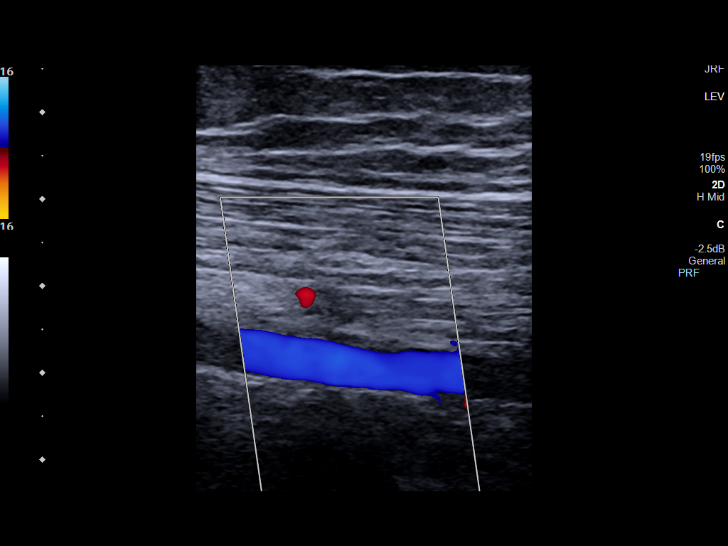
[im 21/33]
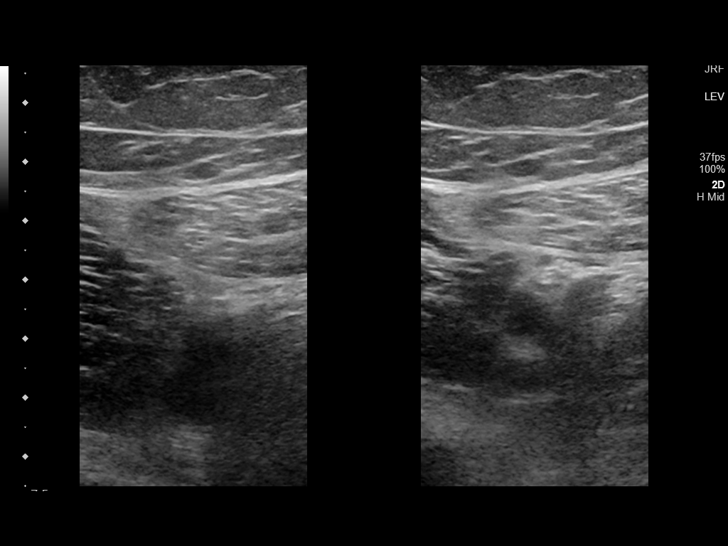
[im 24/33]
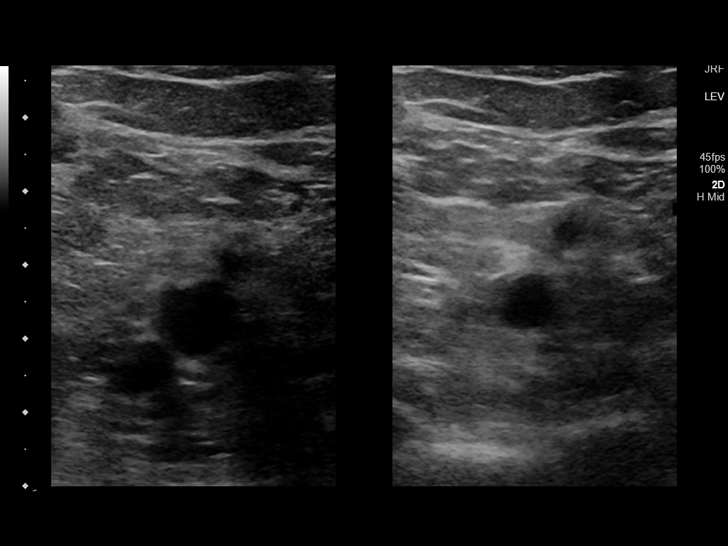
[im 27/33]
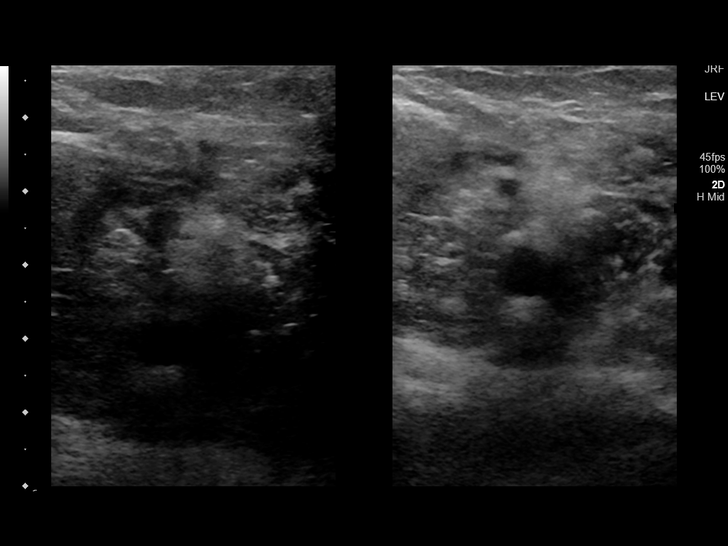
[im 30/33]
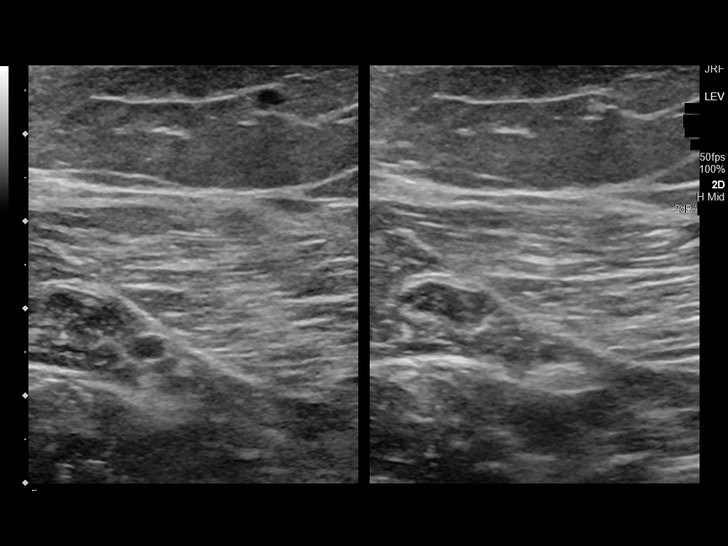
[im 33/33]
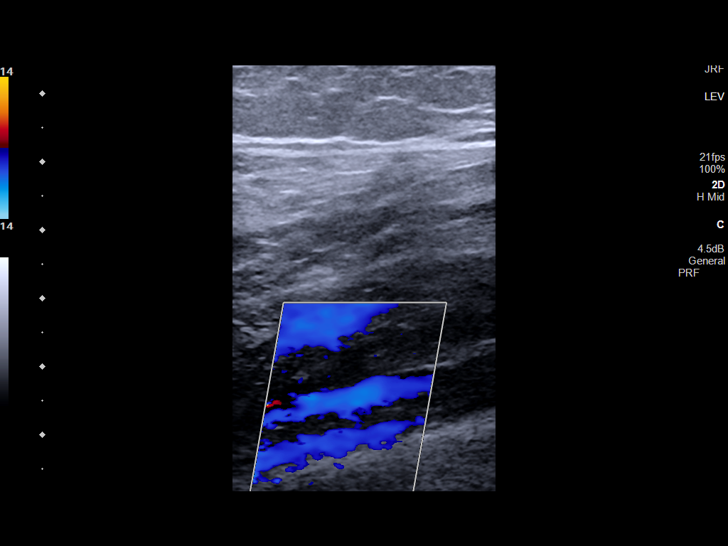

[13 of 24 positions shown; findings below may reference images not displayed]

FINDINGS: Contralateral Common Femoral Vein: Respiratory phasicity is normal
and symmetric with the symptomatic side. No evidence of thrombus.
Normal compressibility.

Common Femoral Vein: No evidence of thrombus. Normal
compressibility, respiratory phasicity and response to augmentation.

Saphenofemoral Junction: No evidence of thrombus. Normal
compressibility and flow on color Doppler imaging.

Profunda Femoral Vein: No evidence of thrombus. Normal
compressibility and flow on color Doppler imaging.

Femoral Vein: No evidence of thrombus. Normal compressibility,
respiratory phasicity and response to augmentation.

Popliteal Vein: No evidence of thrombus. Normal compressibility,
respiratory phasicity and response to augmentation.

Calf Veins: No evidence of thrombus. Normal compressibility and flow
on color Doppler imaging.

Superficial Great Saphenous Vein: No evidence of thrombus. Normal
compressibility.

Venous Reflux:  None.

Other Findings:  None.
IMPRESSION: No evidence of deep venous thrombosis in the right lower extremity.
Left common femoral vein also patent.

## 2019-10-26 IMAGING — US US EXTREM LOW*R* LIMITED
1 series · 14 of 17 positions shown · non-contrast
Comparison: Right lower extremity venous duplex [DATE]

CLINICAL DATA: Right leg injury.  Right leg pain.

EXAM:
ULTRASOUND RIGHT LOWER EXTREMITY LIMITED
TECHNIQUE: Ultrasound examination of the lower extremity soft tissues was
performed in the area of clinical concern.

[Series 1: us soft tissue lower extremity limited right (non- · 17 acquisitions, 14 frames shown]
[im 1/17]
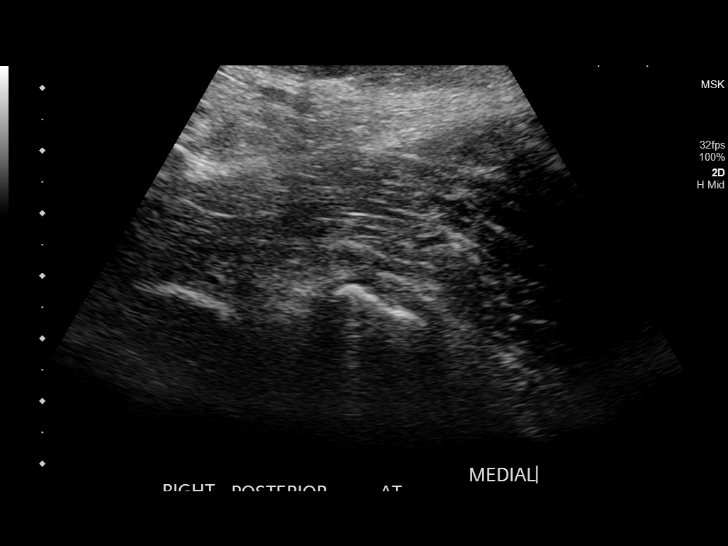
[im 2/17]
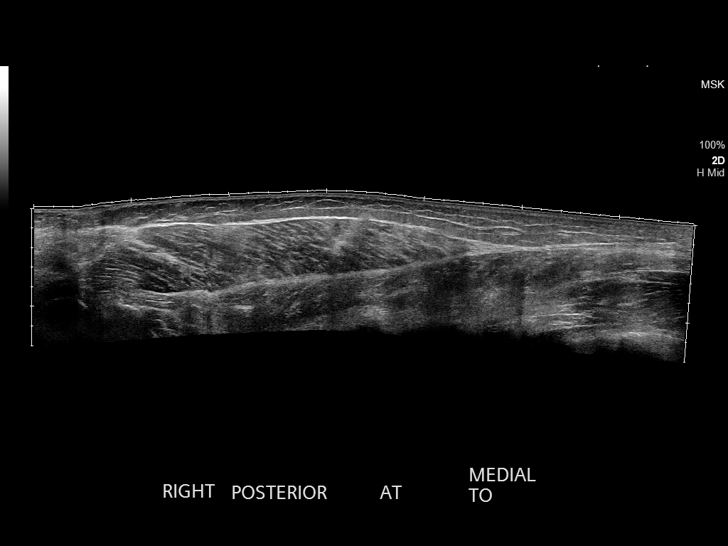
[im 4/17]
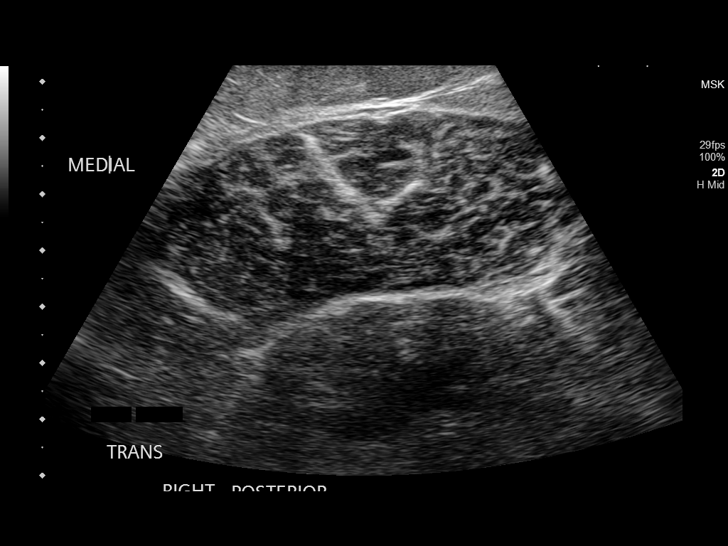
[im 5/17]
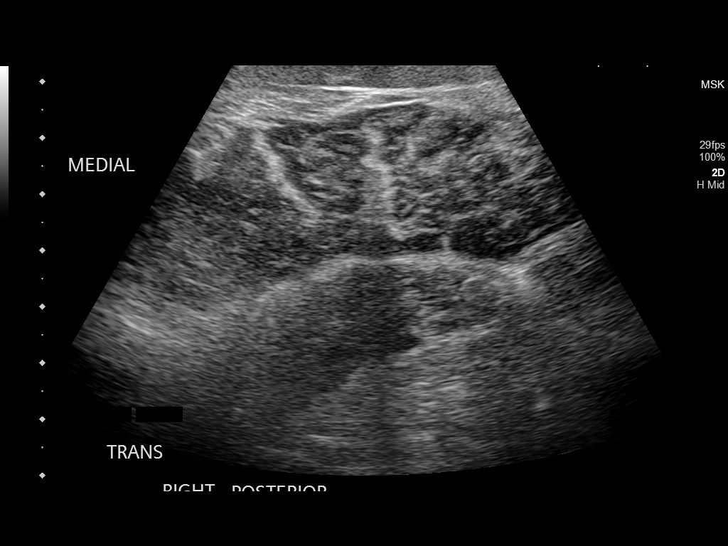
[im 6/17]
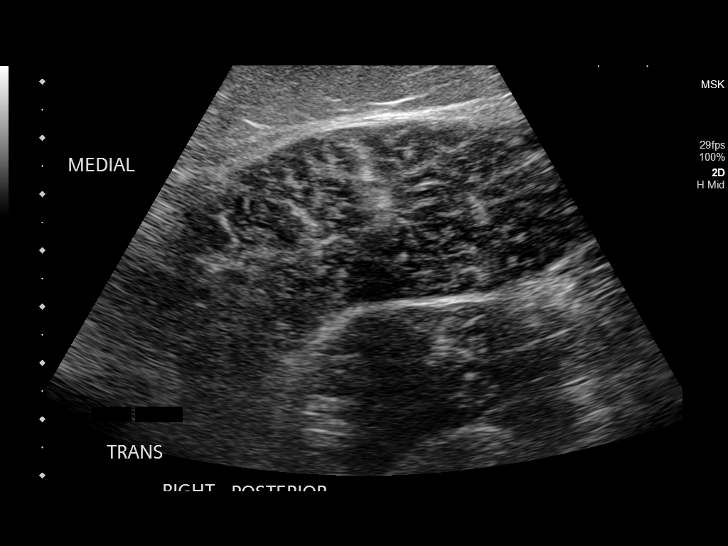
[im 7/17]
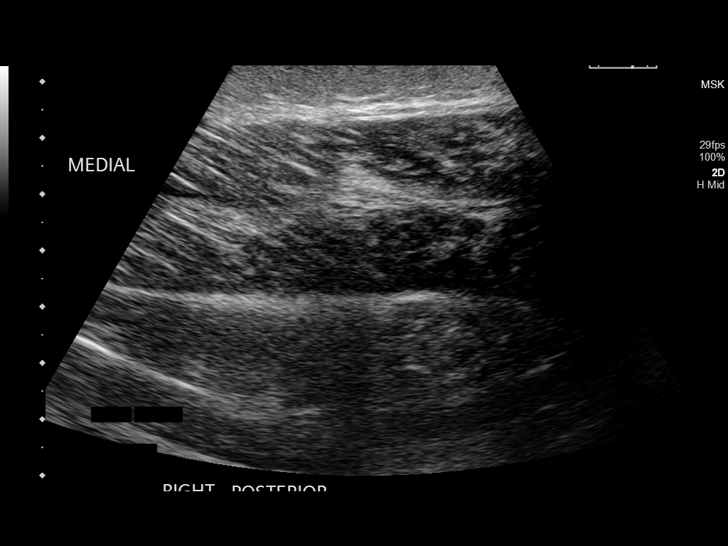
[im 8/17]
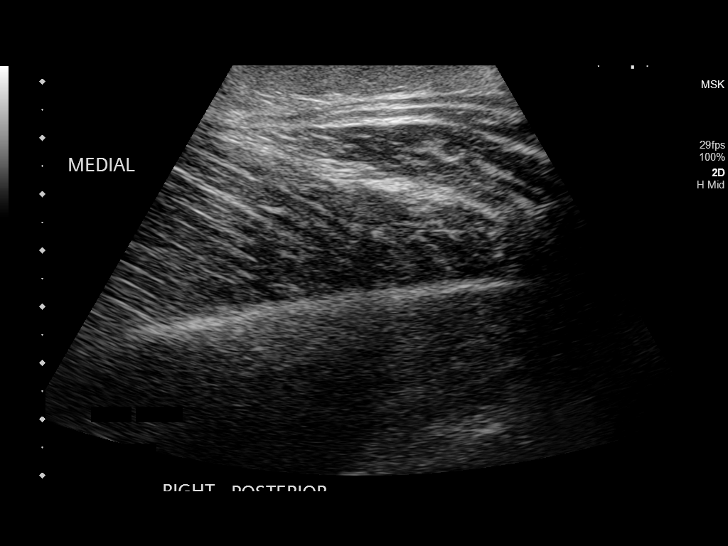
[im 10/17]
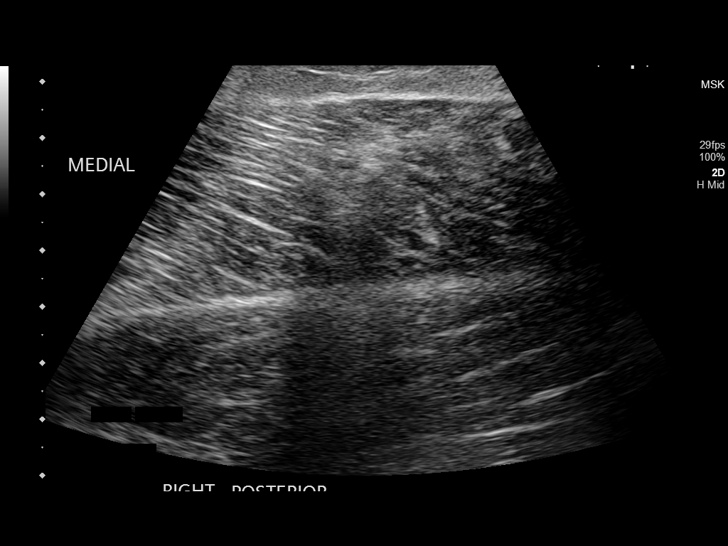
[im 11/17]
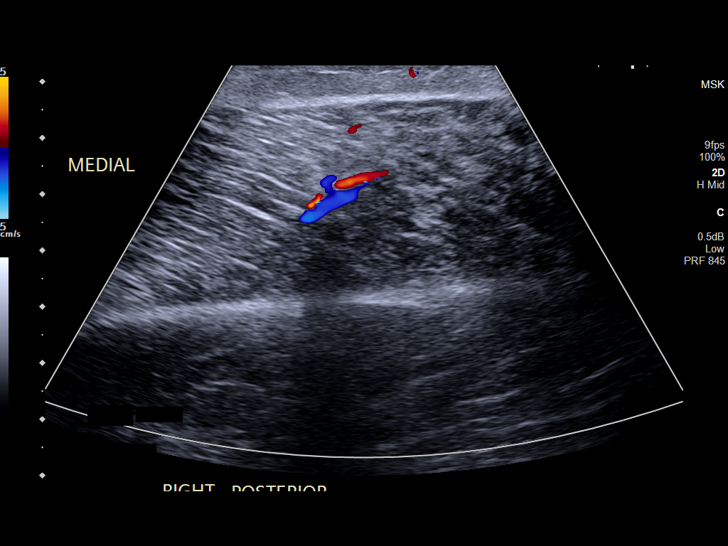
[im 12/17]
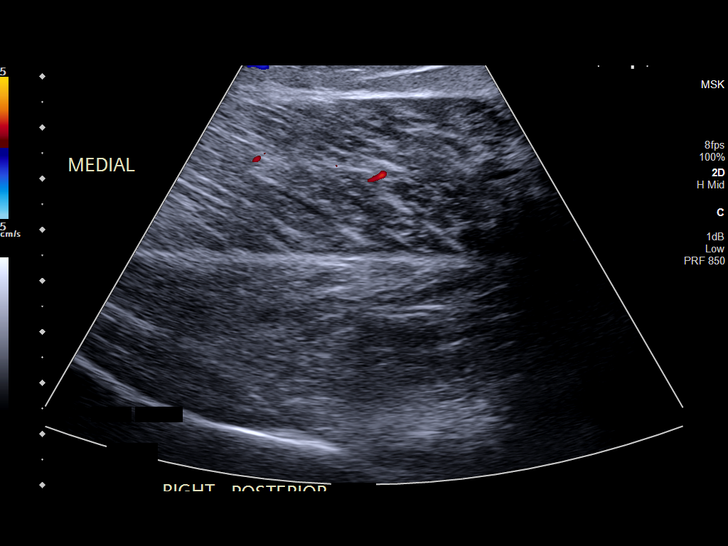
[im 13/17]
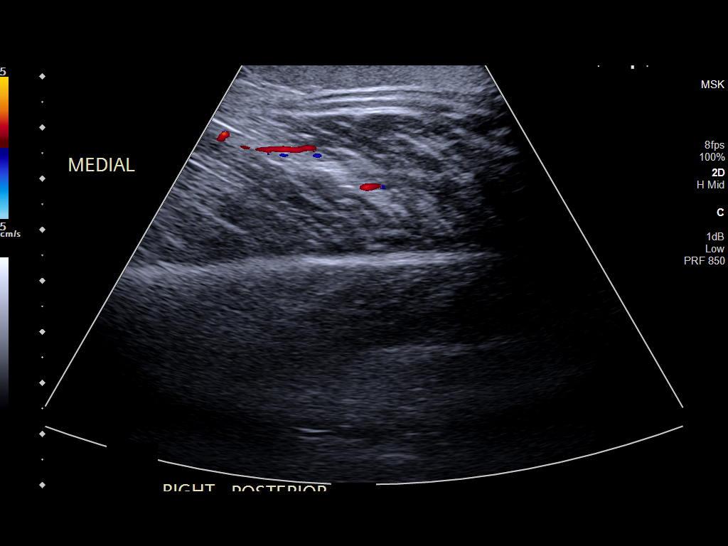
[im 14/17]
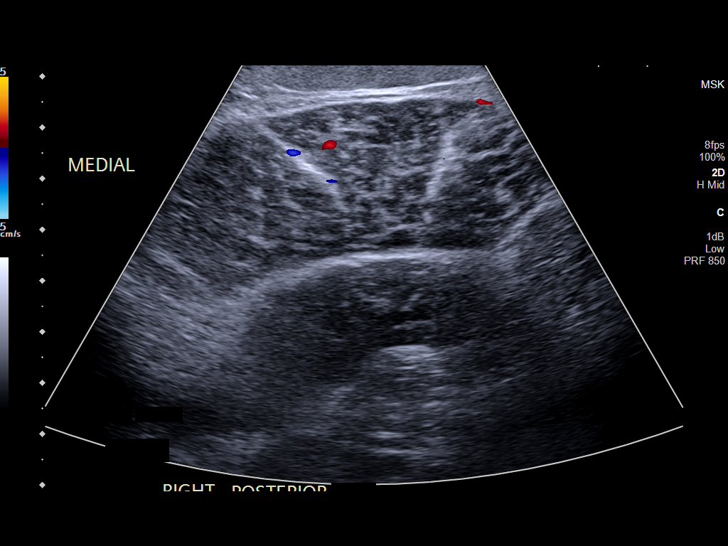
[im 16/17]
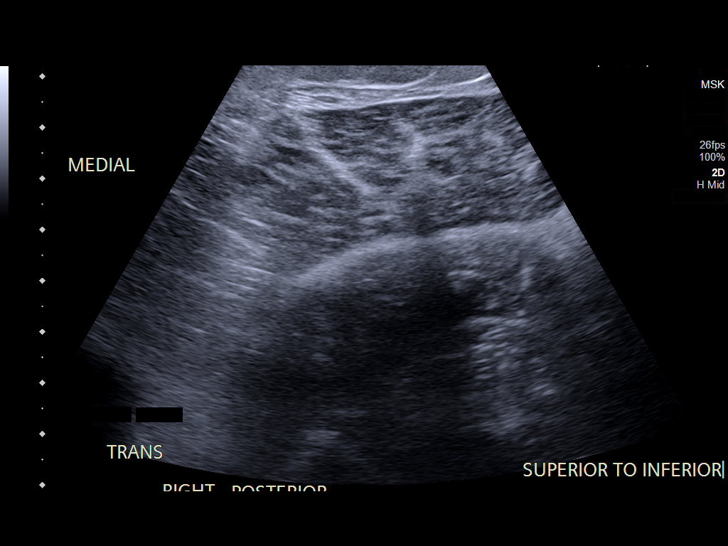
[im 17/17]
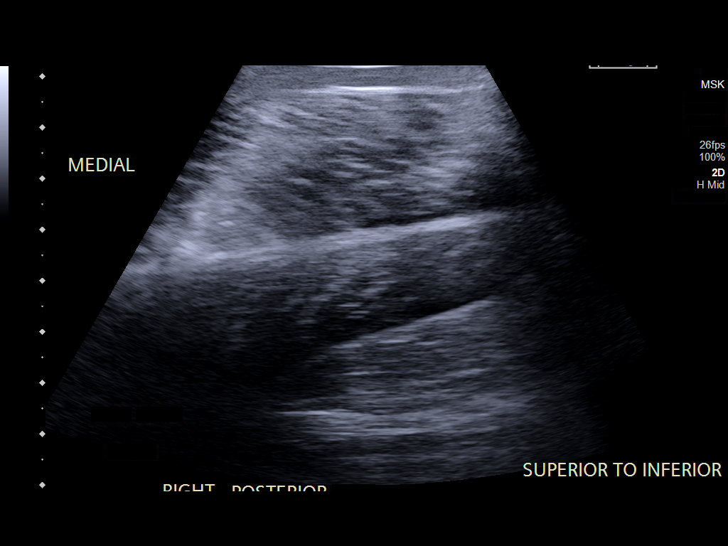

[14 of 17 positions shown; findings below may reference images not displayed]

FINDINGS: Images were obtained in the right medial posterior calf. No focal
soft tissue or cystic lesions identified.
IMPRESSION: No focal abnormality at the area of concern in the right calf.

## 2020-10-15 ENCOUNTER — Other Ambulatory Visit: Payer: Self-pay | Admitting: Sports Medicine

## 2020-10-15 DIAGNOSIS — M25561 Pain in right knee: Secondary | ICD-10-CM

## 2020-10-15 DIAGNOSIS — M25461 Effusion, right knee: Secondary | ICD-10-CM

## 2020-10-15 DIAGNOSIS — S8991XA Unspecified injury of right lower leg, initial encounter: Secondary | ICD-10-CM

## 2020-10-20 ENCOUNTER — Ambulatory Visit
Admission: RE | Admit: 2020-10-20 | Discharge: 2020-10-20 | Disposition: A | Discharge status: Home / Self Care | Payer: BC Managed Care – PPO | Source: Ambulatory Visit | Attending: Sports Medicine | Admitting: Sports Medicine

## 2020-10-20 DIAGNOSIS — M25461 Effusion, right knee: Secondary | ICD-10-CM | POA: Insufficient documentation

## 2020-10-20 DIAGNOSIS — S8991XA Unspecified injury of right lower leg, initial encounter: Secondary | ICD-10-CM | POA: Diagnosis present

## 2020-10-20 DIAGNOSIS — M25561 Pain in right knee: Secondary | ICD-10-CM | POA: Insufficient documentation

## 2020-10-20 IMAGING — MR MR KNEE*R* W/O CM
7 series · 40 of 40 positions shown · non-contrast
Comparison: None.

CLINICAL DATA: Patient felt a popping sensation in the right knee
one week ago playing sports. Now presents with severe pain with
feeling of instability.

EXAM:
MRI OF THE RIGHT KNEE WITHOUT CONTRAST
TECHNIQUE: Multiplanar, multisequence MR imaging of the knee was performed. No
intravenous contrast was administered.

[Series 8: T2 fat-sat · axial · right · 4.0mm · 0.50mm/px · z∈[-99,+25]mm · 6 of 26 slices shown (1 of 3)]
[im 1/26]
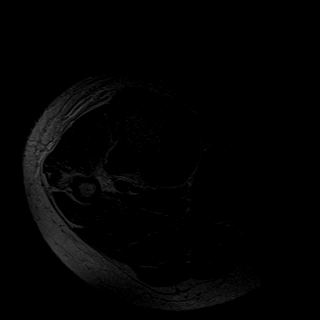
[im 6/26]
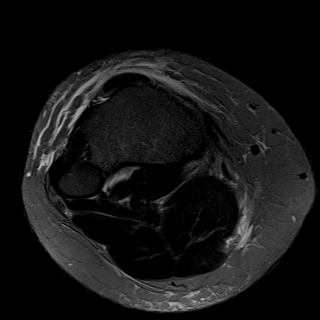
[im 11/26]
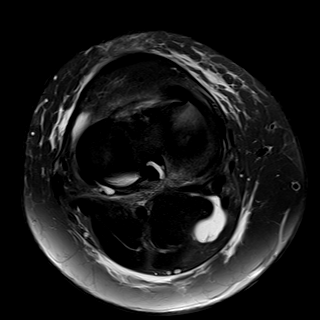
[im 16/26]
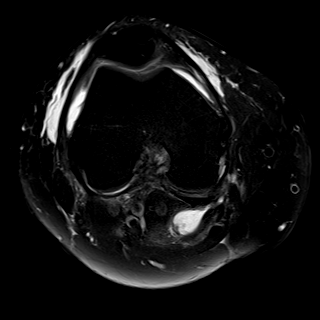
[im 21/26]
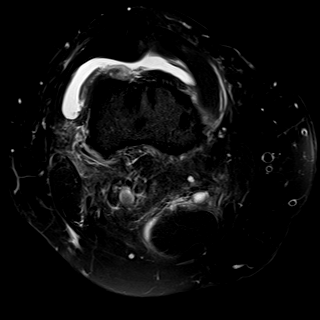
[im 26/26]
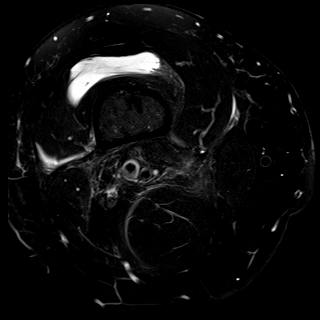

[Series 9: T2 fat-sat · coronal · right · 4.0mm · 0.59mm/px · 6 of 32 slices shown (2 of 3)]
[im 1/32]
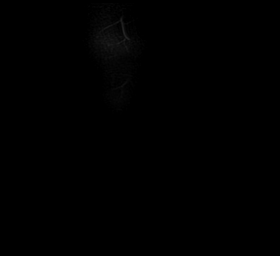
[im 7/32]
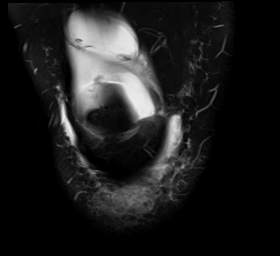
[im 13/32]
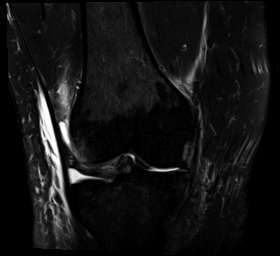
[im 19/32]
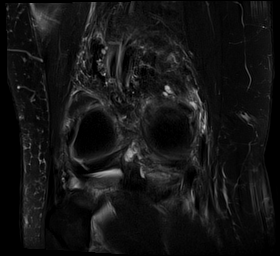
[im 25/32]
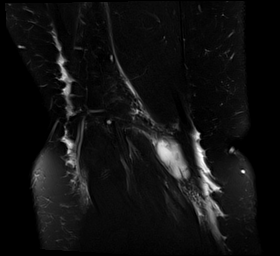
[im 32/32]
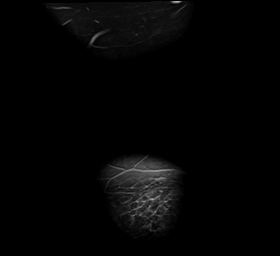

[Series 10: T1 · coronal · right · 4.0mm · 0.59mm/px · 6 of 32 slices shown]
[im 1/32]
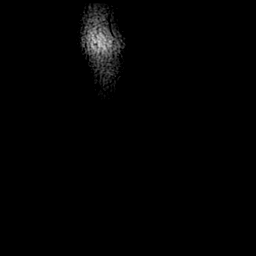
[im 7/32]
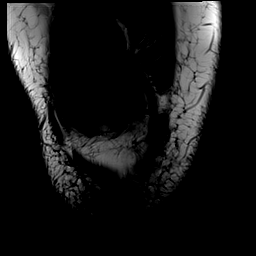
[im 13/32]
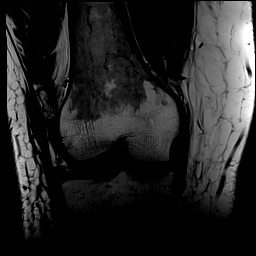
[im 19/32]
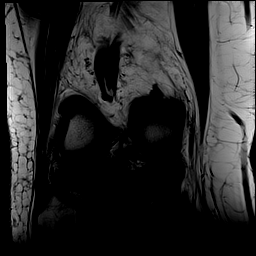
[im 25/32]
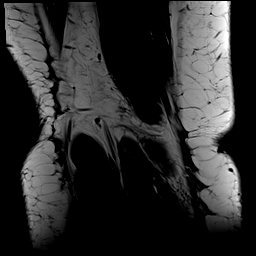
[im 32/32]
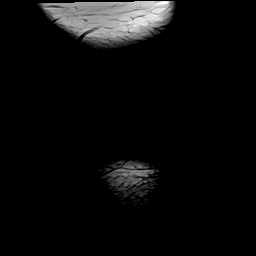

[Series 11: PD fat-sat · coronal · right · 4.0mm · 0.59mm/px · 6 of 31 slices shown (1 of 2)]
[im 1/31]
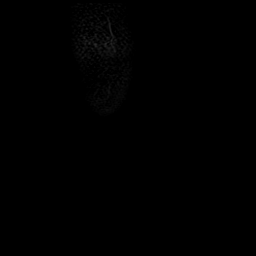
[im 7/31]
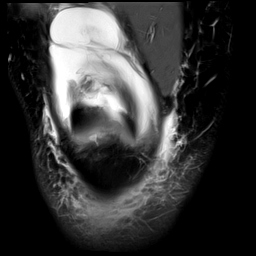
[im 13/31]
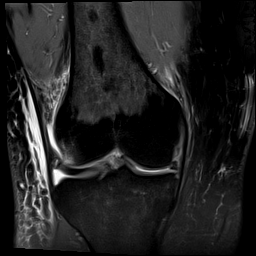
[im 19/31]
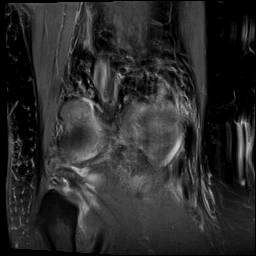
[im 25/31]
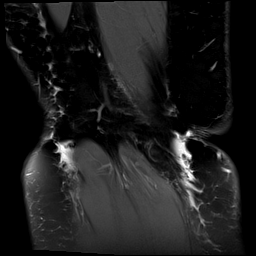
[im 31/31]
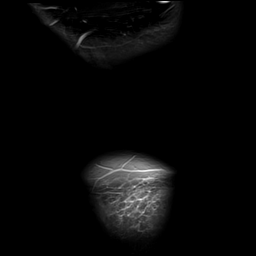

[Series 12: PD fat-sat · sagittal · right · 3.0mm · 0.59mm/px · 6 of 31 slices shown (2 of 2)]
[im 1/31]
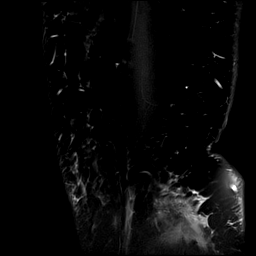
[im 7/31]
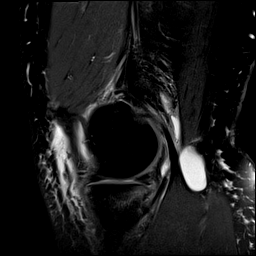
[im 13/31]
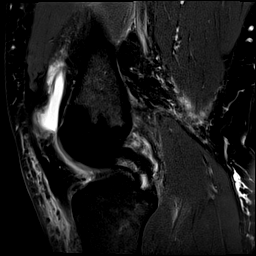
[im 19/31]
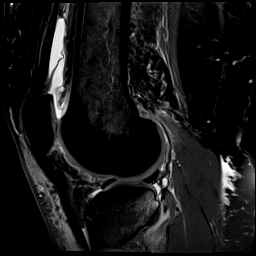
[im 25/31]
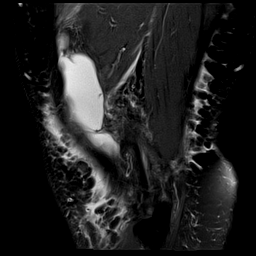
[im 31/31]
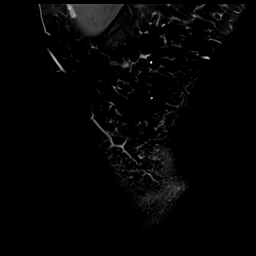

[Series 13: T2 fat-sat · sagittal · right · 3.0mm · 0.59mm/px · 7 of 35 slices shown (3 of 3)]
[im 1/35]
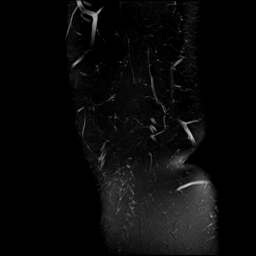
[im 6/35]
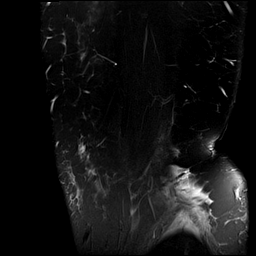
[im 12/35]
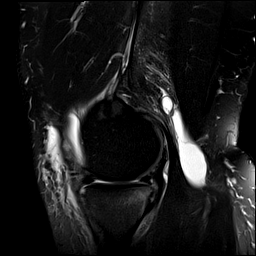
[im 18/35]
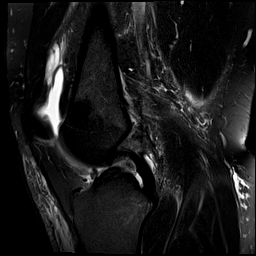
[im 23/35]
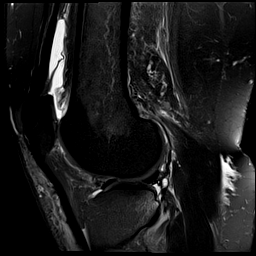
[im 29/35]
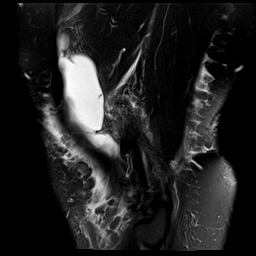
[im 35/35]
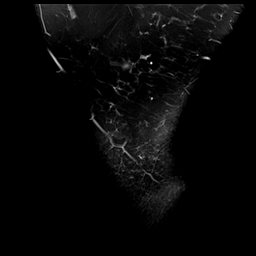

[Series 14: PD · coronal · right · 2.0mm · 0.47mm/px · 3 of 16 slices shown]
[im 1/16]
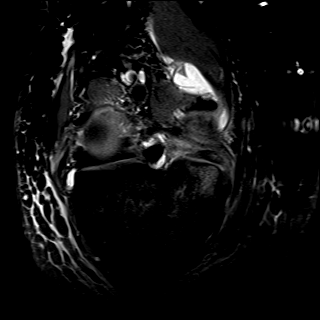
[im 8/16]
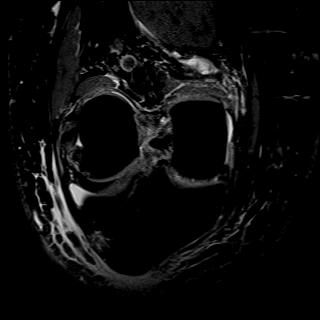
[im 16/16]
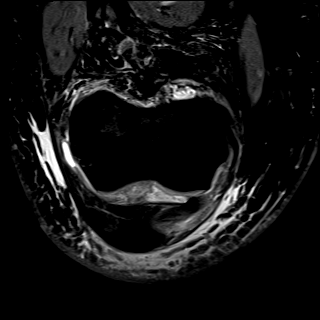

[40 of 40 positions shown; findings below may reference images not displayed]

FINDINGS: MENISCI

Medial meniscus: Extensive vertical tear of the medial meniscus with
bucket-handle flap displaced into the intercondylar notch (series
12, images 18-22).

Lateral meniscus:  Intact.

LIGAMENTS

Cruciates:  Complete tear of the ACL.  Intact PCL.

Collaterals: Intact MCL with periligamentous edema. Proximal fibular
collateral ligament is mildly thickened without tear. Lateral
collateral ligament complex otherwise intact.

CARTILAGE

Patellofemoral: Partial-thickness chondral surface irregularity
along the midportion of the lateral patellar facet. No trochlear
chondral defect.

Medial: Mild chondral thinning and surface irregularity of the
weight-bearing medial compartment.

Lateral:  No chondral defect.

Joint:  Small joint effusion.  Mild edema throughout Hoffa's fat.

Popliteal Fossa: Small-moderate-sized Baker's cyst. Intact popliteus
tendon.

Extensor Mechanism:  Intact quadriceps tendon and patellar tendon.

Bones: Bone marrow edema at the peripheral aspect of the lateral
femoral condyle and at the posterior aspects of the medial and
lateral tibial plateau. No cortical fracture is seen. No suspicious
bone lesion.

Other: Soft tissue swelling with ill-defined fluid most pronounced
at the anterior aspect of the knee.
IMPRESSION: 1. Complete ACL tear with associated pivot-shift bone contusions.
2. Displaced bucket-handle tear of the medial meniscus.
3. Grade 1 sprains of the MCL and fibular collateral ligament.
4. Small joint effusion with small-moderate Baker's cyst.
5. Mild medial and patellofemoral compartment cartilage
irregularities.

## 2020-10-24 ENCOUNTER — Other Ambulatory Visit: Payer: Self-pay | Admitting: Orthopedic Surgery

## 2020-10-30 ENCOUNTER — Other Ambulatory Visit: Payer: Self-pay

## 2020-10-30 ENCOUNTER — Encounter
Admission: RE | Admit: 2020-10-30 | Discharge: 2020-10-30 | Disposition: A | Discharge status: Home / Self Care | Payer: BC Managed Care – PPO | Source: Ambulatory Visit | Attending: Orthopedic Surgery | Admitting: Orthopedic Surgery

## 2020-10-30 ENCOUNTER — Other Ambulatory Visit: Admission: RE | Admit: 2020-10-30 | Payer: BC Managed Care – PPO | Source: Ambulatory Visit

## 2020-10-30 HISTORY — DX: Unspecified osteoarthritis, unspecified site: M19.90

## 2020-10-30 NOTE — Patient Instructions (Addendum)
Your procedure is scheduled on: 11/04/20 - Monday  Report to the Registration Desk on the 1st floor of the [facility]. To find out your arrival time, please call [phone] between 1PM - 3PM on: 11/01/20   REMEMBER: Instructions that are not followed completely may result in serious medical risk, up to and including death; or upon the discretion of your surgeon and anesthesiologist your surgery may need to be rescheduled.  Do not eat food after midnight the night before surgery.  No gum chewing, lozengers or hard candies.  You may however, drink CLEAR liquids up to 2 hours before you are scheduled to arrive for your surgery. Do not drink anything within 2 hours of your scheduled arrival time.  Clear liquids include: - water  - apple juice without pulp - gatorade (not RED, PURPLE, OR BLUE) - black coffee or tea (Do NOT add milk or creamers to the coffee or tea) Do NOT drink anything that is not on this list.  In addition, your doctor has ordered for you to drink the provided  Ensure Pre-Surgery Clear Carbohydrate Drink  Drinking this carbohydrate drink up to two hours before surgery helps to reduce insulin resistance and improve patient outcomes. Please complete drinking 2 hours prior to scheduled arrival time.  TAKE THESE MEDICATIONS THE MORNING OF SURGERY WITH A SIP OF WATER: - BREO ELLIPTA 200-25 MCG/INH AEPB  Use albuterol (PROVENTIL HFA;VENTOLIN HFA) 108 (90 BASE) MCG/ACT inhaler on the day of surgery and bring to the hospital.  One week prior to surgery: Stop Anti-inflammatories (NSAIDS) such as Advil, Aleve, Ibuprofen, Motrin, Naproxen, Naprosyn and Aspirin based products such as Excedrin, Goodys Powder, BC Powder.  Stop ANY OVER THE COUNTER supplements until after surgery.  You may however, continue to take Tylenol if needed for pain up until the day of surgery.  No Alcohol for 24 hours before or after surgery.  No Smoking including e-cigarettes for 24 hours prior to  surgery.  No chewable tobacco products for at least 6 hours prior to surgery.  No nicotine patches on the day of surgery.  Do not use any "recreational" drugs for at least a week prior to your surgery.  Please be advised that the combination of cocaine and anesthesia may have negative outcomes, up to and including death. If you test positive for cocaine, your surgery will be cancelled.  On the morning of surgery brush your teeth with toothpaste and water, you may rinse your mouth with mouthwash if you wish. Do not swallow any toothpaste or mouthwash.  Use CHG Soap or wipes as directed on instruction sheet.  Do not wear jewelry, make-up, hairpins, clips or nail polish.  Do not wear lotions, powders, or perfumes.   Do not shave body from the neck down 48 hours prior to surgery just in case you cut yourself which could leave a site for infection.  Also, freshly shaved skin may become irritated if using the CHG soap.  Contact lenses, hearing aids and dentures may not be worn into surgery.  Do not bring valuables to the hospital. [facility] is not responsible for any missing/lost belongings or valuables.   Notify your doctor if there is any change in your medical condition (cold, fever, infection).  Wear comfortable clothing (specific to your surgery type) to the hospital.  After surgery, you can help prevent lung complications by doing breathing exercises.  Take deep breaths and cough every 1-2 hours. Your doctor may order a device called an Facilities manager  to help you take deep breaths. When coughing or sneezing, hold a pillow firmly against your incision with both hands. This is called "splinting." Doing this helps protect your incision. It also decreases belly discomfort.  If you are being admitted to the hospital overnight, leave your suitcase in the car. After surgery it may be brought to your room.  If you are being discharged the day of surgery, you will not be allowed to  drive home. You will need a responsible adult (18 years or older) to drive you home and stay with you that night.   If you are taking public transportation, you will need to have a responsible adult (18 years or older) with you. Please confirm with your physician that it is acceptable to use public transportation.   Please call the Pre-admissions Testing Dept. at [phone] if you have any questions about these instructions.  Surgery Visitation Policy:  Patients undergoing a surgery or procedure may have one family member or support person with them as long as that person is not COVID-19 positive or experiencing its symptoms.  That person may remain in the waiting area during the procedure and may rotate out with other people.  Inpatient Visitation:    Visiting hours are 7 a.m. to 8 p.m. Up to two visitors ages 16+ are allowed at one time in a patient room. The visitors may rotate out with other people during the day. Visitors must check out when they leave, or other visitors will not be allowed. One designated support person may remain overnight. The visitor must pass COVID-19 screenings, use hand sanitizer when entering and exiting the patient's room and wear a mask at all times, including in the patient's room. Patients must also wear a mask when staff or their visitor are in the room. Masking is required regardless of vaccination status.

## 2020-11-04 ENCOUNTER — Ambulatory Visit: Payer: BC Managed Care – PPO | Admitting: Anesthesiology

## 2020-11-04 ENCOUNTER — Ambulatory Visit: Payer: BC Managed Care – PPO

## 2020-11-04 ENCOUNTER — Encounter: Payer: Self-pay | Admitting: Orthopedic Surgery

## 2020-11-04 ENCOUNTER — Encounter
Admission: RE | Disposition: A | Discharge status: Home / Self Care | Payer: Self-pay | Source: Home / Self Care | Attending: Orthopedic Surgery

## 2020-11-04 ENCOUNTER — Ambulatory Visit
Admission: RE | Admit: 2020-11-04 | Discharge: 2020-11-04 | Disposition: A | Discharge status: Home / Self Care | Payer: BC Managed Care – PPO | Attending: Orthopedic Surgery | Admitting: Orthopedic Surgery

## 2020-11-04 DIAGNOSIS — Z880 Allergy status to penicillin: Secondary | ICD-10-CM | POA: Diagnosis not present

## 2020-11-04 DIAGNOSIS — Z881 Allergy status to other antibiotic agents status: Secondary | ICD-10-CM | POA: Diagnosis not present

## 2020-11-04 DIAGNOSIS — S83511A Sprain of anterior cruciate ligament of right knee, initial encounter: Secondary | ICD-10-CM | POA: Diagnosis not present

## 2020-11-04 DIAGNOSIS — Z888 Allergy status to other drugs, medicaments and biological substances status: Secondary | ICD-10-CM | POA: Diagnosis not present

## 2020-11-04 DIAGNOSIS — X58XXXA Exposure to other specified factors, initial encounter: Secondary | ICD-10-CM | POA: Diagnosis not present

## 2020-11-04 DIAGNOSIS — S83211A Bucket-handle tear of medial meniscus, current injury, right knee, initial encounter: Secondary | ICD-10-CM | POA: Diagnosis not present

## 2020-11-04 DIAGNOSIS — Y9364 Activity, baseball: Secondary | ICD-10-CM | POA: Diagnosis not present

## 2020-11-04 DIAGNOSIS — G8918 Other acute postprocedural pain: Secondary | ICD-10-CM

## 2020-11-04 HISTORY — PX: ANTERIOR CRUCIATE LIGAMENT REPAIR: SHX115

## 2020-11-04 LAB — POCT PREGNANCY, URINE: Preg Test, Ur: NEGATIVE

## 2020-11-04 SURGERY — RECONSTRUCTION, KNEE, ACL
Anesthesia: General | Site: Knee | Laterality: Right

## 2020-11-04 MED ORDER — KETOROLAC TROMETHAMINE 30 MG/ML IJ SOLN
INTRAMUSCULAR | Status: AC
  Filled 2020-11-04: qty 1

## 2020-11-04 MED ORDER — OXYCODONE HCL 5 MG PO TABS: 5.0000 mg | ORAL_TABLET | ORAL | 0 refills | Status: DC | PRN

## 2020-11-04 MED ORDER — EPINEPHRINE PF 1 MG/ML IJ SOLN
INTRAMUSCULAR | Status: AC
  Filled 2020-11-04: qty 4

## 2020-11-04 MED ORDER — ROCURONIUM BROMIDE 10 MG/ML (PF) SYRINGE
PREFILLED_SYRINGE | INTRAVENOUS | Status: AC
  Filled 2020-11-04: qty 10

## 2020-11-04 MED ORDER — ONDANSETRON HCL 4 MG/2ML IJ SOLN
INTRAMUSCULAR | Status: AC
  Filled 2020-11-04: qty 2

## 2020-11-04 MED ORDER — LACTATED RINGERS IR SOLN
Status: DC | PRN
  Administered 2020-11-04: 6000 mL

## 2020-11-04 MED ORDER — DEXAMETHASONE SODIUM PHOSPHATE 4 MG/ML IJ SOLN
INTRAMUSCULAR | Status: DC | PRN
  Administered 2020-11-04: 2 mg via PERINEURAL

## 2020-11-04 MED ORDER — CHLORHEXIDINE GLUCONATE 0.12 % MT SOLN: 15.0000 mL | Freq: Once | OROMUCOSAL | Status: DC

## 2020-11-04 MED ORDER — FENTANYL CITRATE (PF) 100 MCG/2ML IJ SOLN
INTRAMUSCULAR | Status: AC
  Administered 2020-11-04: 25 ug via INTRAVENOUS
  Filled 2020-11-04: qty 2

## 2020-11-04 MED ORDER — ROPIVACAINE HCL 5 MG/ML IJ SOLN
INTRAMUSCULAR | Status: DC | PRN
  Administered 2020-11-04: 25 mL via PERINEURAL

## 2020-11-04 MED ORDER — DEXMEDETOMIDINE HCL IN NACL 200 MCG/50ML IV SOLN
INTRAVENOUS | Status: AC
  Filled 2020-11-04: qty 50

## 2020-11-04 MED ORDER — ONDANSETRON 4 MG PO TBDP: 4.0000 mg | ORAL_TABLET | Freq: Three times a day (TID) | ORAL | 0 refills | Status: DC | PRN

## 2020-11-04 MED ORDER — GABAPENTIN 300 MG PO CAPS: 300.0000 mg | ORAL_CAPSULE | Freq: Three times a day (TID) | ORAL | 0 refills | Status: DC

## 2020-11-04 MED ORDER — MIDAZOLAM HCL 2 MG/2ML IJ SOLN
INTRAMUSCULAR | Status: AC
  Filled 2020-11-04: qty 2

## 2020-11-04 MED ORDER — SUGAMMADEX SODIUM 500 MG/5ML IV SOLN
INTRAVENOUS | Status: DC | PRN
  Administered 2020-11-04: 250 mg via INTRAVENOUS

## 2020-11-04 MED ORDER — PHENYLEPHRINE HCL (PRESSORS) 10 MG/ML IV SOLN
INTRAVENOUS | Status: AC
  Filled 2020-11-04: qty 1

## 2020-11-04 MED ORDER — FAMOTIDINE 20 MG PO TABS
ORAL_TABLET | ORAL | Status: AC
  Administered 2020-11-04: 20 mg via ORAL
  Filled 2020-11-04: qty 1

## 2020-11-04 MED ORDER — ONDANSETRON HCL 4 MG/2ML IJ SOLN
INTRAMUSCULAR | Status: DC | PRN
  Administered 2020-11-04: 4 mg via INTRAVENOUS

## 2020-11-04 MED ORDER — SUGAMMADEX SODIUM 500 MG/5ML IV SOLN
INTRAVENOUS | Status: AC
  Filled 2020-11-04: qty 5

## 2020-11-04 MED ORDER — ASPIRIN EC 325 MG PO TBEC: 325.0000 mg | DELAYED_RELEASE_TABLET | Freq: Every day | ORAL | 0 refills | Status: DC

## 2020-11-04 MED ORDER — MIDAZOLAM HCL 2 MG/2ML IJ SOLN
INTRAMUSCULAR | Status: DC | PRN
  Administered 2020-11-04: 2 mg via INTRAVENOUS

## 2020-11-04 MED ORDER — LACTATED RINGERS IV SOLN: INTRAVENOUS | Status: DC

## 2020-11-04 MED ORDER — ORAL CARE MOUTH RINSE: 15.0000 mL | Freq: Once | OROMUCOSAL | Status: DC

## 2020-11-04 MED ORDER — CEFAZOLIN SODIUM-DEXTROSE 2-4 GM/100ML-% IV SOLN
INTRAVENOUS | Status: AC
  Filled 2020-11-04: qty 100

## 2020-11-04 MED ORDER — 0.9 % SODIUM CHLORIDE (POUR BTL) OPTIME
TOPICAL | Status: DC | PRN
  Administered 2020-11-04: 200 mL

## 2020-11-04 MED ORDER — KETOROLAC TROMETHAMINE 30 MG/ML IJ SOLN
INTRAMUSCULAR | Status: DC | PRN
  Administered 2020-11-04: 30 mg via INTRAVENOUS

## 2020-11-04 MED ORDER — LIDOCAINE HCL (PF) 2 % IJ SOLN
INTRAMUSCULAR | Status: AC
  Filled 2020-11-04: qty 5

## 2020-11-04 MED ORDER — DEXAMETHASONE SODIUM PHOSPHATE 10 MG/ML IJ SOLN
INTRAMUSCULAR | Status: AC
  Filled 2020-11-04: qty 1

## 2020-11-04 MED ORDER — BUPIVACAINE HCL (PF) 0.5 % IJ SOLN
INTRAMUSCULAR | Status: AC
  Filled 2020-11-04: qty 30

## 2020-11-04 MED ORDER — ACETAMINOPHEN 10 MG/ML IV SOLN
INTRAVENOUS | Status: AC
  Filled 2020-11-04: qty 100

## 2020-11-04 MED ORDER — ACETAMINOPHEN 500 MG PO TABS: 1000.0000 mg | ORAL_TABLET | Freq: Three times a day (TID) | ORAL | 2 refills | Status: DC

## 2020-11-04 MED ORDER — FAMOTIDINE 20 MG PO TABS: 20.0000 mg | ORAL_TABLET | Freq: Once | ORAL | Status: AC

## 2020-11-04 MED ORDER — DEXMEDETOMIDINE HCL IN NACL 200 MCG/50ML IV SOLN
INTRAVENOUS | Status: DC | PRN
  Administered 2020-11-04 (×2): 4 ug via INTRAVENOUS
  Administered 2020-11-04: 8 ug via INTRAVENOUS
  Administered 2020-11-04: 4 ug via INTRAVENOUS

## 2020-11-04 MED ORDER — LACTATED RINGERS IV SOLN
INTRAVENOUS | Status: DC | PRN
  Administered 2020-11-04: 12000 mL

## 2020-11-04 MED ORDER — PROPOFOL 10 MG/ML IV BOLUS
INTRAVENOUS | Status: DC | PRN
  Administered 2020-11-04: 30 mg via INTRAVENOUS
  Administered 2020-11-04: 100 mg via INTRAVENOUS

## 2020-11-04 MED ORDER — FENTANYL CITRATE (PF) 100 MCG/2ML IJ SOLN
INTRAMUSCULAR | Status: DC | PRN
  Administered 2020-11-04 (×2): 50 ug via INTRAVENOUS

## 2020-11-04 MED ORDER — PROPOFOL 10 MG/ML IV BOLUS
INTRAVENOUS | Status: AC
  Filled 2020-11-04: qty 20

## 2020-11-04 MED ORDER — KETAMINE HCL 10 MG/ML IJ SOLN
INTRAMUSCULAR | Status: DC | PRN
  Administered 2020-11-04 (×3): 10 mg via INTRAVENOUS
  Administered 2020-11-04: 20 mg via INTRAVENOUS

## 2020-11-04 MED ORDER — PROMETHAZINE HCL 25 MG/ML IJ SOLN: 6.2500 mg | INTRAMUSCULAR | Status: DC | PRN

## 2020-11-04 MED ORDER — FENTANYL CITRATE (PF) 100 MCG/2ML IJ SOLN
25.0000 ug | INTRAMUSCULAR | Status: DC | PRN
  Administered 2020-11-04 (×3): 25 ug via INTRAVENOUS

## 2020-11-04 MED ORDER — KETAMINE HCL 50 MG/5ML IJ SOSY
PREFILLED_SYRINGE | INTRAMUSCULAR | Status: AC
  Filled 2020-11-04: qty 5

## 2020-11-04 MED ORDER — APREPITANT 40 MG PO CAPS: 40.0000 mg | ORAL_CAPSULE | Freq: Once | ORAL | Status: DC

## 2020-11-04 MED ORDER — ROPIVACAINE HCL 5 MG/ML IJ SOLN
INTRAMUSCULAR | Status: AC
  Filled 2020-11-04: qty 30

## 2020-11-04 MED ORDER — FENTANYL CITRATE (PF) 100 MCG/2ML IJ SOLN
INTRAMUSCULAR | Status: AC
  Filled 2020-11-04: qty 2

## 2020-11-04 MED ORDER — ROCURONIUM BROMIDE 100 MG/10ML IV SOLN
INTRAVENOUS | Status: DC | PRN
  Administered 2020-11-04: 20 mg via INTRAVENOUS
  Administered 2020-11-04: 40 mg via INTRAVENOUS
  Administered 2020-11-04 (×2): 30 mg via INTRAVENOUS

## 2020-11-04 MED ORDER — CHLORHEXIDINE GLUCONATE 0.12 % MT SOLN
OROMUCOSAL | Status: AC
  Filled 2020-11-04: qty 15

## 2020-11-04 MED ORDER — DEXAMETHASONE SODIUM PHOSPHATE 4 MG/ML IJ SOLN
INTRAMUSCULAR | Status: AC
  Filled 2020-11-04: qty 1

## 2020-11-04 MED ORDER — ACETAMINOPHEN 10 MG/ML IV SOLN
INTRAVENOUS | Status: DC | PRN
  Administered 2020-11-04: 1000 mg via INTRAVENOUS

## 2020-11-04 MED ORDER — CEFAZOLIN SODIUM-DEXTROSE 2-4 GM/100ML-% IV SOLN
2.0000 g | INTRAVENOUS | Status: AC
  Administered 2020-11-04: 2 g via INTRAVENOUS

## 2020-11-04 MED ORDER — IBUPROFEN 800 MG PO TABS: 800.0000 mg | ORAL_TABLET | Freq: Three times a day (TID) | ORAL | 1 refills | Status: AC

## 2020-11-04 MED ORDER — LIDOCAINE-EPINEPHRINE 1 %-1:100000 IJ SOLN
INTRAMUSCULAR | Status: AC
  Filled 2020-11-04: qty 1

## 2020-11-04 MED ORDER — LIDOCAINE HCL (CARDIAC) PF 100 MG/5ML IV SOSY
PREFILLED_SYRINGE | INTRAVENOUS | Status: DC | PRN
  Administered 2020-11-04: 50 mg via INTRAVENOUS

## 2020-11-04 MED ORDER — DEXAMETHASONE SODIUM PHOSPHATE 10 MG/ML IJ SOLN
INTRAMUSCULAR | Status: DC | PRN
  Administered 2020-11-04: 10 mg via INTRAVENOUS

## 2020-11-04 MED ORDER — VANCOMYCIN HCL 1000 MG IV SOLR
INTRAVENOUS | Status: DC | PRN
  Administered 2020-11-04: 1000 mg via TOPICAL

## 2020-11-04 SURGICAL SUPPLY — 98 items
ADAPTER IRRIG TUBE 2 SPIKE SOL (ADAPTER) ×4 IMPLANT
ADPR TBG 2 SPK PMP STRL ASCP (ADAPTER) ×2
ANCHOR BUTTON TIGHTROPE RC 20 (Anchor) ×2 IMPLANT
APL PRP STRL LF DISP 70% ISPRP (MISCELLANEOUS) ×2
BASIN GRAD PLASTIC 32OZ STRL (MISCELLANEOUS) ×2 IMPLANT
BLADE SHAVER 4.5X7 STR FR (MISCELLANEOUS) ×2 IMPLANT
BLADE SURG 15 STRL LF DISP TIS (BLADE) ×2 IMPLANT
BLADE SURG 15 STRL SS (BLADE) ×4
BLADE SURG SZ10 CARB STEEL (BLADE) ×2 IMPLANT
BLADE SURG SZ11 CARB STEEL (BLADE) ×2 IMPLANT
BNDG COHESIVE 4X5 TAN ST LF (GAUZE/BANDAGES/DRESSINGS) ×2 IMPLANT
BNDG COHESIVE 6X5 TAN ST LF (GAUZE/BANDAGES/DRESSINGS) ×2 IMPLANT
BNDG ESMARK 6X12 TAN STRL LF (GAUZE/BANDAGES/DRESSINGS) ×2 IMPLANT
BRUSH SCRUB EZ  4% CHG (MISCELLANEOUS) ×1
BRUSH SCRUB EZ 4% CHG (MISCELLANEOUS) ×1 IMPLANT
BUR 5.5 NOTCHBLASTER STR (BURR) ×1 IMPLANT
BUR BR 5.5 WIDE MOUTH (BURR) IMPLANT
BURR 5.5 NOTCHBLASTER STR (BURR) ×2
CARTRIDGE SUT 2-0 NONSTITCH (Anchor) ×2 IMPLANT
CHLORAPREP W/TINT 26 (MISCELLANEOUS) ×4 IMPLANT
CLEANER CAUTERY TIP 5X5 PAD (MISCELLANEOUS) ×1 IMPLANT
COOLER POLAR GLACIER W/PUMP (MISCELLANEOUS) ×2 IMPLANT
COVER BACK TABLE REUSABLE LG (DRAPES) ×2 IMPLANT
COVER MAYO STAND REUSABLE (DRAPES) ×2 IMPLANT
CUFF TOURN SGL QUICK 34 (TOURNIQUET CUFF) ×2
CUFF TRNQT CYL 34X4.125X (TOURNIQUET CUFF) ×1 IMPLANT
CUTTER SUT KNOT PUSHER AIR (CUTTER) ×2 IMPLANT
DEVICE MENISCAL CVD UP (Anchor) ×20 IMPLANT
DRAPE 3/4 80X56 (DRAPES) ×4 IMPLANT
DRAPE ARTHRO LIMB 89X125 STRL (DRAPES) ×2 IMPLANT
DRAPE FLUOR MINI C-ARM 54X84 (DRAPES) ×2 IMPLANT
DRAPE IMP U-DRAPE 54X76 (DRAPES) ×2 IMPLANT
DRAPE ORTHO SPLIT 77X108 STRL (DRAPES) ×2
DRAPE POUCH INSTRU U-SHP 10X18 (DRAPES) ×2 IMPLANT
DRAPE SURG ORHT 6 SPLT 77X108 (DRAPES) ×1 IMPLANT
DRILL FLIPCUTTER III 6-12 (ORTHOPEDIC DISPOSABLE SUPPLIES) ×1 IMPLANT
ELECT REM PT RETURN 9FT ADLT (ELECTROSURGICAL) ×2
ELECTRODE REM PT RTRN 9FT ADLT (ELECTROSURGICAL) ×1 IMPLANT
FLIPCUTTER III 6-12 AR-1204FF (ORTHOPEDIC DISPOSABLE SUPPLIES) ×2
GAUZE SPONGE 4X4 12PLY STRL (GAUZE/BANDAGES/DRESSINGS) ×2 IMPLANT
GAUZE XEROFORM 1X8 LF (GAUZE/BANDAGES/DRESSINGS) ×2 IMPLANT
GLOVE SRG 8 PF TXTR STRL LF DI (GLOVE) ×2 IMPLANT
GLOVE SURG SYN 8.0 (GLOVE) ×2 IMPLANT
GLOVE SURG UNDER POLY LF SZ8 (GLOVE) ×4
GOWN STRL REUS W/ TWL LRG LVL3 (GOWN DISPOSABLE) ×1 IMPLANT
GOWN STRL REUS W/ TWL XL LVL3 (GOWN DISPOSABLE) ×1 IMPLANT
GOWN STRL REUS W/TWL LRG LVL3 (GOWN DISPOSABLE) ×2
GOWN STRL REUS W/TWL XL LVL3 (GOWN DISPOSABLE) ×2
GRADUATE 1200CC STRL 31836 (MISCELLANEOUS) ×2 IMPLANT
GUIDEWIRE 1.2MMX18 (WIRE) ×2 IMPLANT
HANDLE YANKAUER SUCT BULB TIP (MISCELLANEOUS) ×2 IMPLANT
IMP SYS 2ND FIX PEEK 4.75X19.1 (Miscellaneous) ×4 IMPLANT
IMPL SYS 2ND FX PEEK 4.75X19.1 (Miscellaneous) ×2 IMPLANT
IMPL TIGHTROP ABS ACL FIBERTG (Orthopedic Implant) ×1 IMPLANT
IMPL TIGHTROP FIBERTAG ACL (Orthopedic Implant) ×1 IMPLANT
IMPL TIGHTROPE ABS ACL FIBERTG (Orthopedic Implant) ×2 IMPLANT
IMPLANT TIGHTROPE FIBERTAG ACL (Orthopedic Implant) ×2 IMPLANT
IV LACTATED RINGER IRRG 3000ML (IV SOLUTION) ×12
IV LR IRRIG 3000ML ARTHROMATIC (IV SOLUTION) ×6 IMPLANT
KIT TRANSTIBIAL (DISPOSABLE) ×2 IMPLANT
KIT TURNOVER KIT A (KITS) ×2 IMPLANT
KNIFE BLADE PARALLEL SZ10 (BLADE) ×2 IMPLANT
MANAGER SUT NOVOCUT (CUTTER) ×2 IMPLANT
MANIFOLD NEPTUNE II (INSTRUMENTS) ×4 IMPLANT
MAT ABSORB  FLUID 56X50 GRAY (MISCELLANEOUS) ×2
MAT ABSORB FLUID 56X50 GRAY (MISCELLANEOUS) ×2 IMPLANT
NEEDLE HYPO 22GX1.5 SAFETY (NEEDLE) ×2 IMPLANT
NEEDLE MAYO 6 CRC TAPER PT (NEEDLE) ×2 IMPLANT
NOVOSTICH PRO MENISCAL 2-0 (Miscellaneous) ×2 IMPLANT
PACK ARTHROSCOPY KNEE (MISCELLANEOUS) ×2 IMPLANT
PAD ABD DERMACEA PRESS 5X9 (GAUZE/BANDAGES/DRESSINGS) ×4 IMPLANT
PAD CLEANER CAUTERY TIP 5X5 (MISCELLANEOUS) ×1
PAD WRAPON POLAR KNEE (MISCELLANEOUS) ×1 IMPLANT
PENCIL ELECTRO HAND CTR (MISCELLANEOUS) ×2 IMPLANT
PENCIL SMOKE EVACUATOR (MISCELLANEOUS) ×2 IMPLANT
REAMER LOW PROFILE 10MM (INSTRUMENTS) ×2 IMPLANT
SCREW BIOCOMP 9X28 (Screw) ×2 IMPLANT
SHAVER BLADE BONE CUTTER  5.5 (BLADE)
SHAVER BLADE BONE CUTTER 5.5 (BLADE) IMPLANT
SPONGE T-LAP 18X18 ~~LOC~~+RFID (SPONGE) ×6 IMPLANT
STRIP CLOSURE SKIN 1/2X4 (GAUZE/BANDAGES/DRESSINGS) ×2 IMPLANT
SUCTION FRAZIER HANDLE 10FR (MISCELLANEOUS)
SUCTION TUBE FRAZIER 10FR DISP (MISCELLANEOUS) IMPLANT
SUT ETHILON 3-0 FS-10 30 BLK (SUTURE) ×2
SUT FIBERSNARE 2 CLSD LOOP (SUTURE) ×4 IMPLANT
SUT FIBERWIRE #2 38 T-5 BLUE (SUTURE) ×2
SUT MNCRL AB 4-0 PS2 18 (SUTURE) ×4 IMPLANT
SUT VIC AB 0 CT1 36 (SUTURE) ×2 IMPLANT
SUT VIC AB 2-0 CT2 27 (SUTURE) ×4 IMPLANT
SUTURE EHLN 3-0 FS-10 30 BLK (SUTURE) ×1 IMPLANT
SUTURE FIBERWR #2 38 T-5 BLUE (SUTURE) ×1 IMPLANT
SYR BULB IRRIG 60ML STRL (SYRINGE) ×2 IMPLANT
SYSTEM NVSTCH PRO MENISCAL 2-0 (Miscellaneous) ×1 IMPLANT
TRAY FOLEY SLVR 16FR LF STAT (SET/KITS/TRAYS/PACK) ×2 IMPLANT
TUBING INFLOW SET DBFLO PUMP (TUBING) ×2 IMPLANT
TUBING OUTFLOW SET DBLFO PUMP (TUBING) ×2 IMPLANT
WAND WEREWOLF FLOW 90D (MISCELLANEOUS) IMPLANT
WRAPON POLAR PAD KNEE (MISCELLANEOUS) ×2

## 2020-11-04 NOTE — H&P (Signed)
Paper H&P to be scanned into permanent record. H&P reviewed. No significant changes noted.  

## 2020-11-04 NOTE — Op Note (Signed)
Operative Note    SURGERY DATE: 11/04/2020    PRE-OP DIAGNOSIS:  1.  Right knee anterior cruciate ligament tear 2.  Right knee medial meniscus bucket handle tear 3.  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  Right knee anterior cruciate ligament tear 2.  Right knee medial meniscus bucket handle tear 3.  Right knee lateral meniscus tear (vertical tear of the red-white zone of the posterior horn) 4.  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 Right knee anterior cruciate ligament reconstruction with quadriceps tendon autograft 2. Right medial meniscus repair (bucket handle tear) 3. Right lateral meniscus repair (small vertical tear at red-white zone of posterior horn near meniscus root 4. Right knee chondroplasty of the patella and medial femoral condyle   SURGEON: Rosealee Albee, MD   ASSISTANT: Dedra Skeens, PA   ANESTHESIA: General plus postoperative regional anesthesia   ESTIMATED BLOOD LOSS: 25cc   TOTAL IV FLUIDS: per anesthesia   INDICATION(S): Jaclyn Mitchell is a 35 y.o. female who sustained a R knee injury while playing recreational softball. MRI showed an ACL tear, bucket handle medial meniscus tear, and mild degenerative changes to the patella and medial femoral condyle. We discussed risks of surgery including, but not limited to possible ACL and/or meniscus re-tear, infection, bleeding, muscle/nerve damage, DVT, complications of anesthesia, and postoperative knee pain and arthrofibrosis. After discussion of risks, benefits, and alternatives to surgery, the patient and family elected to proceed.  After discussion of risks, benefits, and alternatives to surgery, the patient elected to proceed.      OPERATIVE FINDINGS:    Examination under anesthesia: A careful examination under anesthesia was performed.  Passive range of motion was: Hyperextension: 0.   Extension: 0.  Flexion: 110.  Lachman: 2B. Pivot Shift: grade 2.  Posterior drawer: normal.  Varus stability in full extension: normal.  Varus stability in 30 degrees of flexion: normal.  Valgus stability in full extension: normal.  Valgus stability in 30 degrees of flexion: normal.   Intra-operative findings: A thorough arthroscopic examination of the knee was performed.  The findings are: 1. Suprapatellar pouch: Normal 2. Undersurface of median ridge: Grade 1 softening 3. Medial patellar facet: Focal area of grade 3 degenerative change 4. Lateral patellar facet: Grade 1 softening 5. Trochlea: Grade 1 degenerative changes 6. Lateral gutter/popliteus tendon: Normal 7. Hoffa's fat pad: Normal 8. Medial gutter/plica: Normal 9. ACL: Abnormal: complete femoral avulsion 10. PCL: Normal 11. Medial meniscus: bucket handle tear involving the entire width of the meniscus from the posterior horn to the meniscus body. Posterior horn meniscus root intact.  12. Medial compartment cartilage: Large area of grade 3 degenerative changes with small focal area of grade 4 degenerative change, grade 1 degenerative changes to tibial plateau 13. Lateral meniscus: Vertical tear of the posterior horn adjacent to the meniscus root involving the red-white zone 14. Lateral compartment cartilage: Normal femoral condyle, grade 1 degenerative changes to the tibial plateau   OPERATIVE REPORT:     I identified Jaclyn Mitchell in the pre-operative holding area.  I marked the operative knee with my initials. I reviewed the risks and benefits of the proposed surgical intervention and the patient (and/or patient's guardian) wished to proceed.  Anesthesia was then performed with an adductor canal nerve block.  The patient was transferred to the operative suite and placed in the supine position with  all bony prominences padded.  Care was taken to ensure that the contralateral leg was placed in neutral position and that the operative  leg was well-padded in the leg holder.     Appropriate IV antibiotics were administered within 30 minutes of incision. The extremity was then prepped and draped in standard fashion. A time out was performed confirming the correct extremity, correct patient and correct procedure.   Given the clear presence of a pivot shift on examination under anesthesia, I first directed my attention to the harvest of a quadriceps autograft.  The right lower extremity was exsanguinated with an Esmarch, and a thigh tourniquet was elevated to 250 mmHg.  The total tourniquet time for this case was 139 minutes.     A 4 cm incision was planned just proximal to the proximal pole of the patella.  The incision was made with a 15 blade, and subcutaneous fat was sharply excised to expose the quadriceps tendon.  The peritenon was sharply incised, and the space in between the peritenon and the quadriceps tendon was bluntly developed with a sponge and a key elevator.  A speculum retractor was placed anteriorly, and the quadriceps was easily visualized with the arthroscope.  The vastus lateralis and VMO were clearly identified, as was the junction of the rectus femoris muscle with the proximal aspect of the quadriceps tendon.  Under direct visualization with the arthroscope, an Arthrex 10 mm parallel blade was used to incise the quadriceps tendon from its most proximal extent, to the junction with the patella.  Care was taken not to violate the rectus femoris muscle.  Then, using a 15 blade, the graft was transected and elevated distally off the patella, creating a 7 mm thick partial thickness graft.  Dissection was carried proximally to create a uniformly thick graft, 70 mm in length.  The distal end of the graft was controlled with a #2 Fiberwire stitch, and the Arthrex quadriceps harvester/cutter was loaded over the graft.  At a length of 70 mm, the harvest/cutter was used to transect the graft proximally, and the graft was removed from  the wound. The arthroscope was used to confirm a partial thickness harvest with no remaining violation of the anterior knee capsule.     On the back table, the graft was prepared in standard fashion.  The length of the graft was 65 mm.  Each end was prepared using an Warden/ranger.  The femoral end was secured around a TightRope RT, and the tibial end was secured around an ABS loop.  The femoral end of the graft was 10 mm in diameter, the tibial end was 52mm in diameter.  The graft was tensioned to 20 lbs and reserved for later use.  It was also wrapped in a lap sponge containing a solution of 5 mg/ML of vancomycin to reduce risk of infection.  Standard anterolateral portals was created with an 11-blade.  The arthroscope was introduced through the anterolateral portal, and a full diagnostic arthroscopy was performed as described above.   An anteromedial portal was made under needle localization. A shaver was introduced through the anteromedial portal and used to gently debride the fat pad to improve visualization. Then the ACL remnant was debrided using the shaver, leaving 1-2 mm stump on the tibia for anatomic referencing.   The lateral meniscus was addressed next.  A rasp was used to roughen the edges around the meniscus tear itself.  A Ceterix Novostitch device was used to pass a stitch on  either side of the vertical tear.  A knot was tied, and this reduced the tear anatomically.    The medial meniscus was addressed next. The meniscus was reduced with a probe. A rasp and shaver were used to roughen the capsular tissue about the meniscus.  Eight Stryker IVY Air all-inside suture devices were used to repair the meniscus tear. Sutures were placed on both the tibial and femoral sides. The tear was probed afterwards and found to be stable and well-reduced.    Next, I created the femoral socket. This was performed with an outside-in technique using an Teacher, English as a foreign language. The femoral guide was  hooked around the back wall of the femur and the guide indicated where the lateral femoral incision should be. We made this incision with a 15 blade and followed the angle of the drill sleeve to make the incision through the IT band down to bone. The drill sleeve was pushed down to bone on the lateral femoral condyle and the guide was placed on the anatomic footprint of the ACL. We drilled a 37mm tunnel that was 52mm in length. We then used a FiberStick to pass a suture through the femoral tunnel and out of the anterolateral portal.    I then directed my attention to preparation of the tibial tunnel. A tibial guide set at 60 degrees was inserted through the anteromedial portal and centered over the tibial footprint.  The drill sleeve was then advanced to the proximal medial tibia just just inferior to the pes tendon, through a ~4 cm incision.  The anticipated tunnel length was 38 mm.  A guide pin was then drilled through the proximal tibia under direct arthroscopic visualization into the center of the ACL footprint.  This was then over-reamed with an Arthrex 39mm reamer.  Bony debris and soft tissue was cleared from the metaphyseal and intra-articular aperture of the tunnels with a shaver.    The passing suture was then brought out of the tibial tunnel.  The graft was then advanced into place in standard fashion.  The femoral Tight Rope was deployed on the lateral cortex under direct arthroscopic visualization from the anteromedial portal.  Correct position on the lateral cortex was confirmed fluoroscopically.  The TightRope was then shortened until at least 20 mm of graft was in the femoral tunnel.     I then directed my attention to tibial fixation.  On examining the graft, the wound and of the tibial and was out of the tibial tunnel.  The femoral tight rope was shortened as much as possible, which was an additional 5 mm.  This did not seem to allow for enough room to appropriately seat the button.   Therefore decision was made to achieve primary tibial fixation using a 9 x 28 mm fast thread Bio-Tenodesis screw.  This was placed over a nitinol guidewire that was placed anterior to the graft.  Fixation was achieved in full extension with an axial and posterior drawer load applied to the tibia.  The knee was then cycled 20 times, and the femoral sutures were tightened as much as possible with the knee in full extension.  Next, a hole for a 4.75 mm SwiveLock was drilled approximately 2 cm distal to the tibial tunnel.  The ends of the tibial sutures were placed under tension and the anchor was advanced.  This served as a back-up tibial fixation.    A repeat examination under anesthesia was performed.  The patient retained full hyperextension and flexion.  The Lachman's was normalized.  The arthroscope was re-introduced into the knee joint, confirming excellent position and tension of the quadriceps autograft.  There was at least 20 mm of graft in both tunnels.  There was no lateral wall or roof impingement.  Tibial and femoral sutures were cut.   The wounds were irrigated. 2-0 Vicryl was used to close the quadriceps paratenon.  0 Vicryl was used to close the sartorius fascia.  2-0 Vicryl was used to close the subdermal layers of both the quadriceps tendon harvest and the tibial tunnel incisions.  The harvest incision and the proximal medial tibial incisions were then closed with 4-0 Monocryl and Dermabond.  The arthroscopy portals and lateral femoral incision were closed with 3-0 Nylon.  A sterile dressing was applied, followed by a Polar Care device and a hinged knee brace locked in full extension.   The patient was awakened from anesthesia without difficulty and was transferred to the PACU in stable condition. The patient is to receive a postoperative peripheral nerve block prior to discharge home.    Of note, assistance from a PA was essential to performing the surgery.  PA was present for the entire  surgery.  PA assisted with patient positioning, retraction, instrumentation, and wound closure. The surgery would have been more difficult and had longer operative time without PA assistance.     Of note, the meniscus tear required significant additional complexity to repair appropriately. Given the bucket-handle nature of the tear, it was extremely unstable and required a total of 8 stitches to be placed. This required significantly increased surgeon effort and added over 30 minutes to the standard surgical time for meniscus repair.   POSTOPERATIVE PLAN: The patient will be discharged home today once they meet PACU criteria. FFWB x 4 weeks. WBAT with crutches from weeks 4-6. Aspirin 325 mg daily for 2 weeks for DVT prophylaxis. Start physical therapy on POD#3-4. Follow up in 2 weeks per protocol.

## 2020-11-04 NOTE — Anesthesia Postprocedure Evaluation (Signed)
Anesthesia Post Note  Patient: Jaclyn Mitchell  Procedure(s) Performed: Right arthroscopic ACL reconstruction using quadriceps tendon autograft, medial meniscus repair of bucket handle tear, possible chondroplasty (Right: Knee)  Patient location during evaluation: PACU Anesthesia Type: General Level of consciousness: awake and alert Pain management: pain level controlled Vital Signs Assessment: post-procedure vital signs reviewed and stable Respiratory status: spontaneous breathing, nonlabored ventilation, respiratory function stable and patient connected to nasal cannula oxygen Cardiovascular status: blood pressure returned to baseline and stable Postop Assessment: no apparent nausea or vomiting Anesthetic complications: no   No notable events documented.   Last Vitals:  Vitals:   11/04/20 1315 11/04/20 1334  BP: (!) 138/95 122/84  Pulse: 97 95  Resp: 16 18  Temp: (!) 36.2 C (!) 36.1 C  SpO2: 97% 100%    Last Pain:  Vitals:   11/04/20 1334  TempSrc: Temporal  PainSc: 4                  Corinda Gubler

## 2020-11-04 NOTE — Discharge Instructions (Addendum)
Arthroscopic ACL Surgery with Meniscus Repair   Post-Op Instructions   1. Bracing or crutches: Crutches will be provided at the time of discharge from the surgery center.    2. Ice: You may be provided with a device [facility]) that allows you to ice the affected area effectively. Otherwise you can ice manually.   3. Driving:  Driving: Off all narcotic pain meds when operating vehicle   1 week for automatic cars, left leg surgery  4 weeks for standard/manual cars or right leg surgery   4. Activity: Ankle pumps several times an hour while awake to prevent blood clots. Weight bearing: foot-flat weight bearing is permitted with brace locked in extension. The brace should not be unlocked in order to protect the meniscus repair. Unlock only for hygiene and for exercises as directed by physical therapist. Elevate knee above heart level as much as possible for one week. Avoid standing more than 5 minutes (consecutively) for the first week. No exercise involving the knee until cleared by the surgeon or physical therapist. Ideally, you should avoid long distance travel for 4 weeks.   5. Medications:  - You have been provided a prescription for narcotic pain medicine. After surgery, take 1-2 narcotic tablets every 4 hours if needed for severe pain.  - A prescription for anti-nausea medication will be provided in case the narcotic medicine causes nausea - take 1 tablet every 6 hours only if nauseated.  - Take ibuprofen 800 mg every 8 hours with food to reduce post-operative knee swelling. DO NOT STOP IBUPROFEN POST-OP UNTIL INSTRUCTED TO DO SO at first post-op office visit (10-14 days after surgery).  - Take enteric coated aspirin 325 mg once daily for 4 weeks to prevent blood clots.  - Take tylenol 1000mg  (two extra strength tablets) every 8 hours for pain.  May stop tylenol 5 days after surgery if you are having minimal pain. - Take gabapentin 300mg  three times daily for 5 days to help with nerve related  pain.  - You can take an over the counter stool softener to help with narcotic related constipation (Colace, Senna, Miralax, etc.)   If you are taking prescription medication for anxiety, depression, insomnia, muscle spasm, chronic pain, or for attention deficit disorder you are advised that you are at a higher risk of adverse effects with use of narcotics post-op, including narcotic addiction/dependence, depressed breathing, death. If you use non-prescribed substances: alcohol, marijuana, cocaine, heroin, methamphetamines, etc., you are at a higher risk of adverse effects with use of narcotics post-op, including narcotic addiction/dependence, depressed breathing, death. You are advised that taking > 50 morphine milligram equivalents (MME) of narcotic pain medication per day results in twice the risk of overdose or death. For your prescription provided: oxycodone 5 mg - taking more than 6 tablets per day. Be advised that we will prescribe narcotics short-term, for acute post-operative pain only - 1 week for minor operations such as knee arthroscopy for meniscus tear resection, and 3 weeks for major operations such as knee repair/reconstruction surgeries.   6. Bandages: The physical therapist should change the bandages at the first post-op appointment. If needed, the dressing supplies have been provided to you.   7. Physical Therapy: 2 times per week for the first 4 weeks, then 1-2 times per week from weeks 4-8 post-op. Therapy typically starts on post operative Day 3 or 4. You have been provided an order for physical therapy. The therapist will provide home exercises.   8. Work/School: May return  when able to tolerate standing for greater than 2 hours and off of narcotic pain medications. Can return to school usually in ~1-2 weeks.    9. Post-Op Appointments: Your first post-op appointment will be with Dr. Allena Katz in approximately 2 weeks time.    If you find that they have not been scheduled please  call the Orthopaedic Appointment front desk at [phone].    AMBULATORY SURGERY  DISCHARGE INSTRUCTIONS   The drugs that you were given will stay in your system until tomorrow so for the next 24 hours you should not:  Drive an automobile Make any legal decisions Drink any alcoholic beverage   You may resume regular meals tomorrow.  Today it is better to start with liquids and gradually work up to solid foods.  You may eat anything you prefer, but it is better to start with liquids, then soup and crackers, and gradually work up to solid foods.   Please notify your doctor immediately if you have any unusual bleeding, trouble breathing, redness and pain at the surgery site, drainage, fever, or pain not relieved by medication.    Your post-operative visit with Dr.                                       is: Date:                        Time:    Please call to schedule your post-operative visit.  Additional Instructions:

## 2020-11-04 NOTE — Progress Notes (Signed)
Dr. Suzan Slick performed adductor canal block :  RLE Patient tolerated procedure without event, Good coverage noted on ultrasound machine.  Vitals signs monitor during entire procedure, no events noted.

## 2020-11-04 NOTE — Anesthesia Procedure Notes (Signed)
Anesthesia Regional Block: Adductor canal block   Pre-Anesthetic Checklist: , timeout performed,  Correct Patient, Correct Site, Correct Laterality,  Correct Procedure, Correct Position, site marked,  Risks and benefits discussed,  Surgical consent,  Pre-op evaluation,  At surgeon's request and post-op pain management  Laterality: Right and Lower  Prep: chloraprep       Needles:  Injection technique: Single-shot  Needle Type: Echogenic Needle     Needle Length: 9cm  Needle Gauge: 21     Additional Needles:   Procedures:,,,, ultrasound used (permanent image in chart),,    Narrative:  Start time: 11/04/2020 12:18 PM End time: 11/04/2020 12:20 PM Injection made incrementally with aspirations every 5 mL.  Performed by: Personally  Anesthesiologist: Corinda Gubler, MD  Additional Notes: Patient's chart reviewed and they were deemed appropriate candidate for procedure, per surgeon's request. Patient had been educated preopratively  about risks, benefits, and alternatives of the block including but not limited to: temporary or permanent nerve damage, bleeding, infection, damage to surround tissues, block failure, local anesthetic toxicity. Patient had expressed understanding. A formal time-out was conducted consistent with institution rules.  Monitors were applied. The site was prepped with skin prep and allowed to dry, and sterile gloves were used. A high frequency linear ultrasound probe with probe cover was utilized throughout. Femoral artery visualized at mid-thigh level, local anesthetic injected anterolateral to it, and echogenic block needle trajectory was monitored throughout. Hydrodissection of saphenous nerve visualized and appeared anatomically normal. Aspiration performed every 58ml. Blood vessels were avoided. All injections were performed without resistance and free of blood and paresthesias. The patient tolerated the procedure well.  Injectate: 16ml 0.5% ropivacaine + 2mg   decadron

## 2020-11-04 NOTE — Anesthesia Procedure Notes (Signed)
Procedure Name: Intubation Date/Time: 11/04/2020 7:45 AM Performed by: Jonna Clark, CRNA Pre-anesthesia Checklist: Patient identified, Patient being monitored, Timeout performed, Emergency Drugs available and Suction available Patient Re-evaluated:Patient Re-evaluated prior to induction Oxygen Delivery Method: Circle system utilized Preoxygenation: Pre-oxygenation with 100% oxygen Induction Type: IV induction Ventilation: Mask ventilation without difficulty Laryngoscope Size: Mac and 3 Grade View: Grade I Tube type: Oral Tube size: 7.0 mm Number of attempts: 1 Placement Confirmation: ETT inserted through vocal cords under direct vision, positive ETCO2 and breath sounds checked- equal and bilateral Secured at: 21 cm Tube secured with: Tape Dental Injury: Teeth and Oropharynx as per pre-operative assessment

## 2020-11-04 NOTE — Anesthesia Preprocedure Evaluation (Signed)
Anesthesia Evaluation  Patient identified by MRN, date of birth, ID band Patient awake    Reviewed: Allergy & Precautions, H&P , NPO status , Patient's Chart, lab work & pertinent test results, reviewed documented beta blocker date and time   History of Anesthesia Complications Negative for: history of anesthetic complications  Airway Mallampati: III  TM Distance: >3 FB Neck ROM: full    Dental  (+) Dental Advidsory Given, Partial Upper, Teeth Intact, Missing   Pulmonary neg shortness of breath, asthma , neg sleep apnea, neg COPD, neg recent URI,    Pulmonary exam normal breath sounds clear to auscultation       Cardiovascular Exercise Tolerance: Good negative cardio ROS Normal cardiovascular exam Rhythm:regular Rate:Normal     Neuro/Psych negative neurological ROS  negative psych ROS   GI/Hepatic Neg liver ROS, GERD  ,  Endo/Other  negative endocrine ROS  Renal/GU negative Renal ROS  negative genitourinary   Musculoskeletal   Abdominal   Peds  Hematology negative hematology ROS (+)   Anesthesia Other Findings Past Medical History: No date: Arthritis No date: Aspiration pneumonia (HCC) No date: Asthma No date: GERD (gastroesophageal reflux disease)   Reproductive/Obstetrics negative OB ROS                             Anesthesia Physical Anesthesia Plan  ASA: 3  Anesthesia Plan: General   Post-op Pain Management:  Regional for Post-op pain   Induction: Intravenous  PONV Risk Score and Plan: 3 and Ondansetron, Dexamethasone, Midazolam, Promethazine, Treatment may vary due to age or medical condition and Aprepitant  Airway Management Planned: Oral ETT  Additional Equipment:   Intra-op Plan:   Post-operative Plan: Extubation in OR  Informed Consent: I have reviewed the patients History and Physical, chart, labs and discussed the procedure including the risks, benefits and  alternatives for the proposed anesthesia with the patient or authorized representative who has indicated his/her understanding and acceptance.     Dental Advisory Given  Plan Discussed with: Anesthesiologist, CRNA and Surgeon  Anesthesia Plan Comments: (Discussed post-op PNB with patient including risks, benefits, and alternatives.  She consented, but may not need it when she gets to the recovery room.)        Anesthesia Quick Evaluation

## 2020-11-04 NOTE — Transfer of Care (Signed)
Immediate Anesthesia Transfer of Care Note  Patient: Jaclyn Mitchell  Procedure(s) Performed: Right arthroscopic ACL reconstruction using quadriceps tendon autograft, medial meniscus repair of bucket handle tear, possible chondroplasty (Right: Knee)  Patient Location: PACU  Anesthesia Type:General  Level of Consciousness: drowsy and patient cooperative  Airway & Oxygen Therapy: Patient Spontanous Breathing and Patient connected to face mask oxygen  Post-op Assessment: Report given to RN and Post -op Vital signs reviewed and stable  Post vital signs: Reviewed and stable  Last Vitals:  Vitals Value Taken Time  BP 123/72 11/04/20 1140  Temp    Pulse 97 11/04/20 1140  Resp 16 11/04/20 1140  SpO2 100 % 11/04/20 1140  Vitals shown include unvalidated device data.  Last Pain:  Vitals:   11/04/20 0651  PainSc: 4          Complications: No notable events documented.

## 2020-11-05 ENCOUNTER — Encounter: Payer: Self-pay | Admitting: Orthopedic Surgery

## 2020-11-29 ENCOUNTER — Emergency Department: Payer: BC Managed Care – PPO

## 2020-11-29 ENCOUNTER — Other Ambulatory Visit: Payer: Self-pay

## 2020-11-29 ENCOUNTER — Emergency Department
Admission: EM | Admit: 2020-11-29 | Discharge: 2020-11-29 | Disposition: A | Discharge status: Home / Self Care | Payer: BC Managed Care – PPO | Attending: Emergency Medicine | Admitting: Emergency Medicine

## 2020-11-29 ENCOUNTER — Other Ambulatory Visit
Admission: RE | Admit: 2020-11-29 | Discharge: 2020-11-29 | Disposition: A | Discharge status: Home / Self Care | Payer: BC Managed Care – PPO | Source: Ambulatory Visit | Attending: Student | Admitting: Student

## 2020-11-29 DIAGNOSIS — Z91010 Allergy to peanuts: Secondary | ICD-10-CM | POA: Diagnosis not present

## 2020-11-29 DIAGNOSIS — I2699 Other pulmonary embolism without acute cor pulmonale: Secondary | ICD-10-CM

## 2020-11-29 DIAGNOSIS — J45909 Unspecified asthma, uncomplicated: Secondary | ICD-10-CM | POA: Insufficient documentation

## 2020-11-29 DIAGNOSIS — Z7901 Long term (current) use of anticoagulants: Secondary | ICD-10-CM | POA: Diagnosis not present

## 2020-11-29 DIAGNOSIS — R079 Chest pain, unspecified: Secondary | ICD-10-CM | POA: Diagnosis present

## 2020-11-29 DIAGNOSIS — R0781 Pleurodynia: Secondary | ICD-10-CM | POA: Insufficient documentation

## 2020-11-29 HISTORY — DX: Other pulmonary embolism without acute cor pulmonale: I26.99

## 2020-11-29 LAB — BASIC METABOLIC PANEL
Anion gap: 6 (ref 5–15)
BUN: 10 mg/dL (ref 6–20)
CO2: 25 mmol/L (ref 22–32)
Calcium: 8.9 mg/dL (ref 8.9–10.3)
Chloride: 106 mmol/L (ref 98–111)
Creatinine, Ser: 0.87 mg/dL (ref 0.44–1.00)
GFR, Estimated: >60 mL/min (ref >60)
Glucose, Bld: 100 mg/dL — ABNORMAL HIGH (ref 70–99)
Potassium: 4.1 mmol/L (ref 3.5–5.1)
Sodium: 137 mmol/L (ref 135–145)

## 2020-11-29 LAB — D-DIMER, QUANTITATIVE: D-Dimer, Quant: 1.9 ug/mL-FEU — ABNORMAL HIGH (ref 0.00–0.50)

## 2020-11-29 LAB — POC URINE PREG, ED: Preg Test, Ur: NEGATIVE

## 2020-11-29 IMAGING — US US EXTREM LOW VENOUS
1 series · 14 of 24 positions shown · non-contrast
Comparison: [DATE] right lower extremity ultrasound, [DATE]
left lower extremity ultrasound

CLINICAL DATA: PE after recent surgery

EXAM:
Bilateral LOWER EXTREMITY VENOUS DOPPLER ULTRASOUND
TECHNIQUE: Gray-scale sonography with compression, as well as color and duplex
ultrasound, were performed to evaluate the deep venous system(s)
from the level of the common femoral vein through the popliteal and
proximal calf veins.

[Series 1: us venous img lower bilat (dvt) · portal-venous · 14 of 63 slices shown]
[im 1/63]
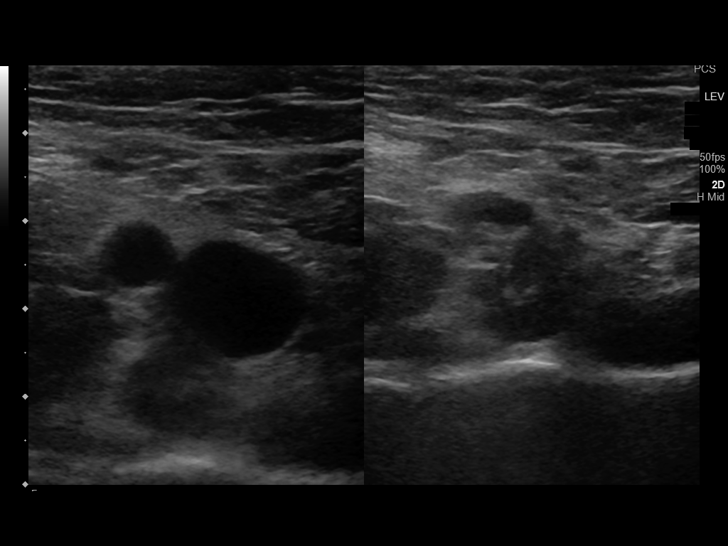
[im 6/63]
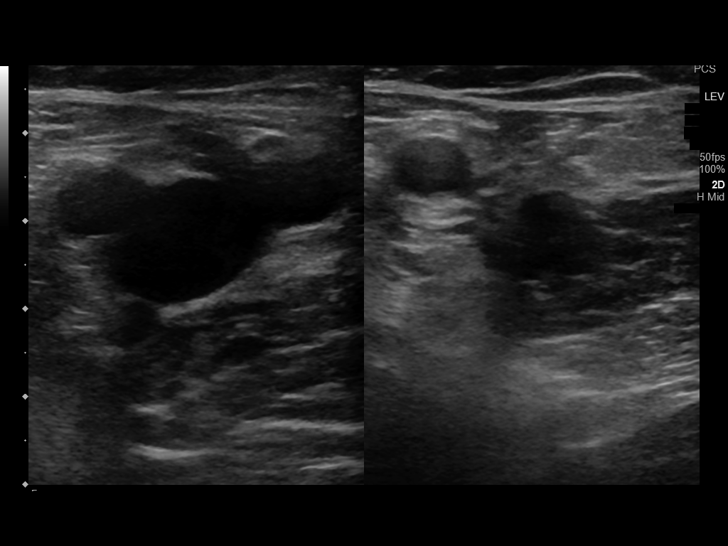
[im 11/63]
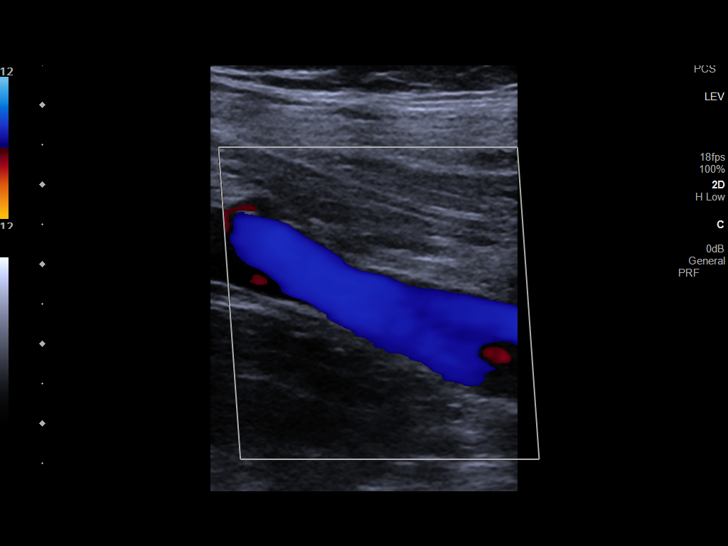
[im 17/63]
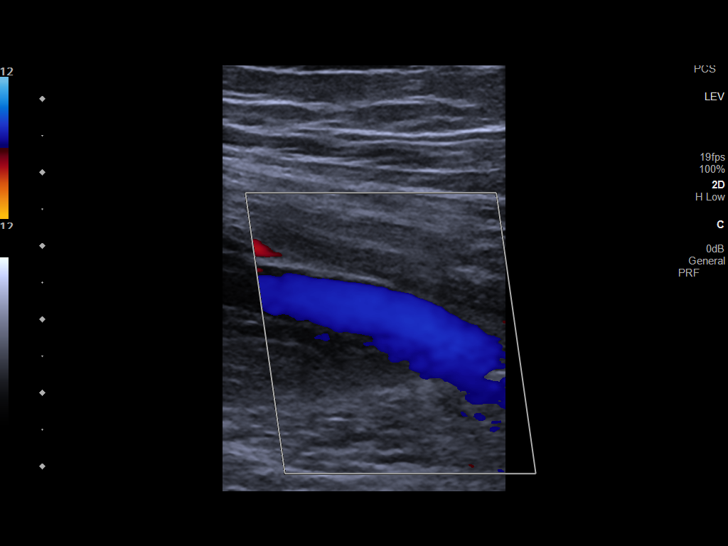
[im 19/63]
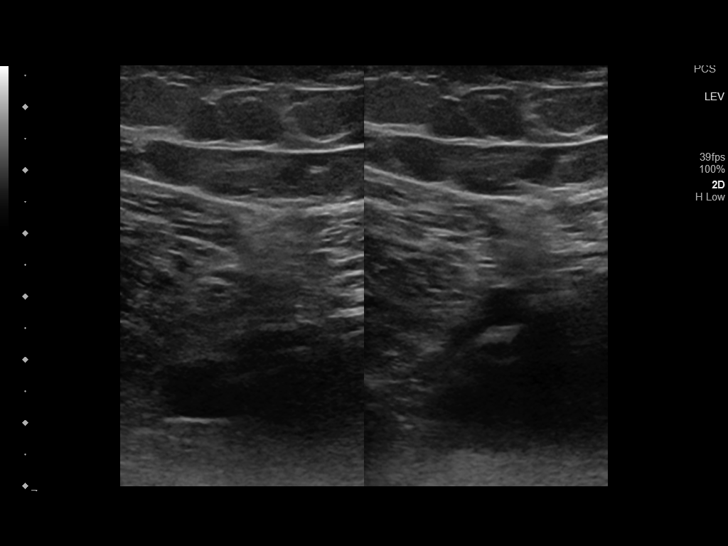
[im 25/63]
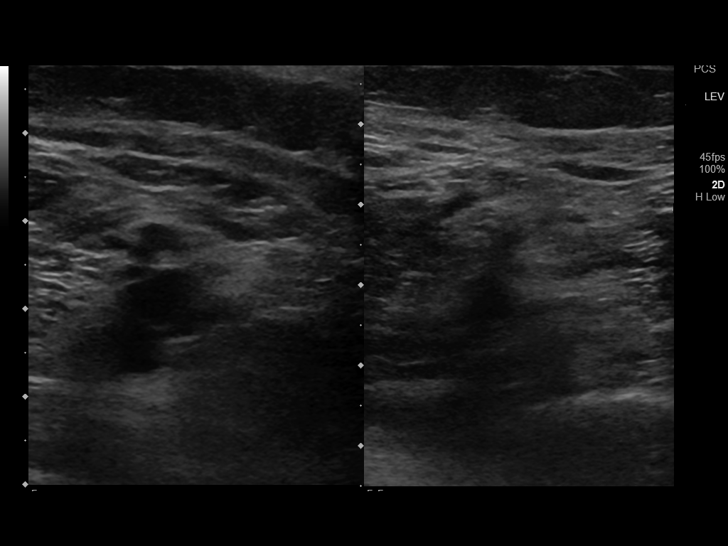
[im 30/63]
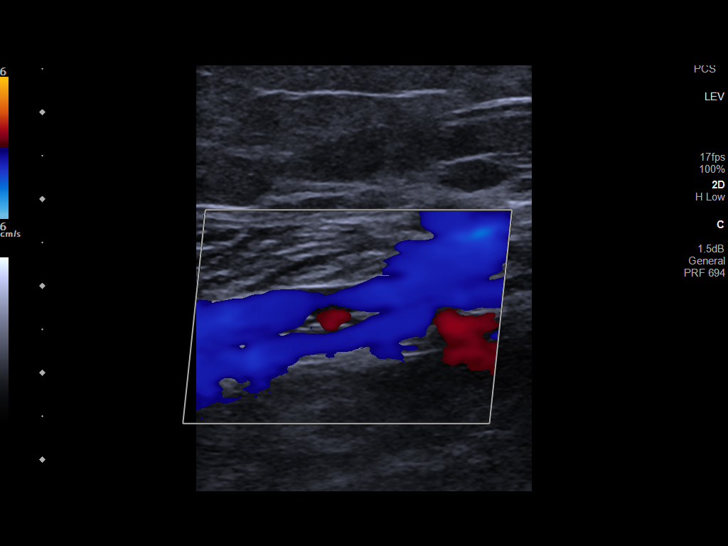
[im 33/63]
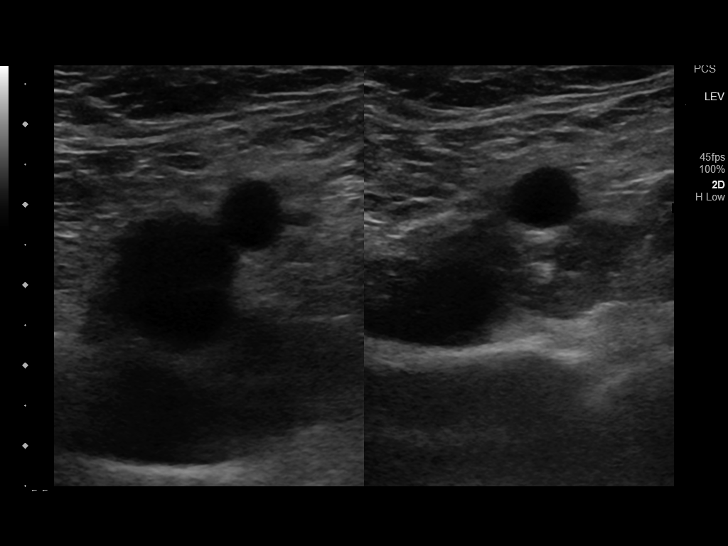
[im 38/63]
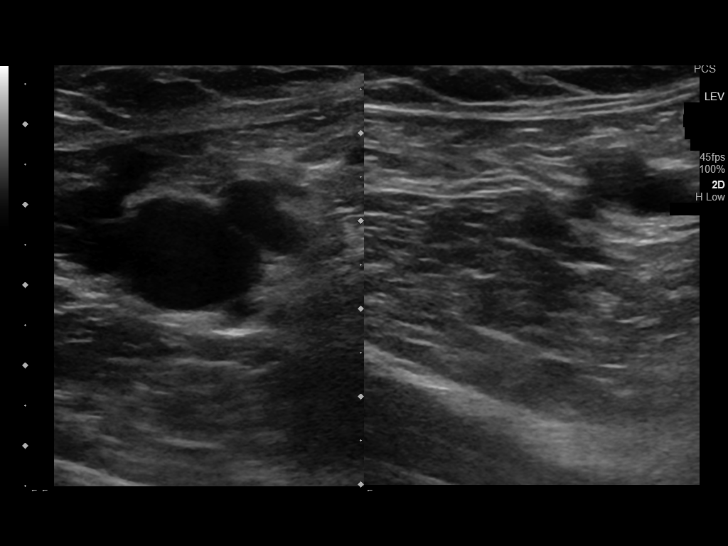
[im 44/63]
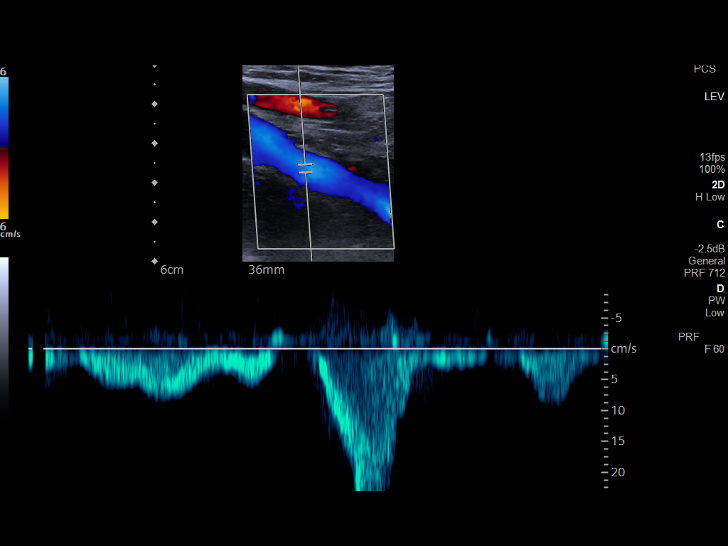
[im 49/63]
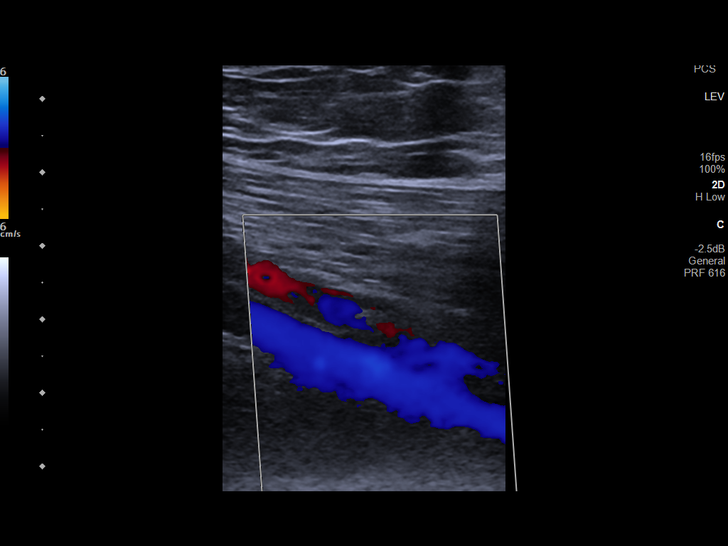
[im 52/63]
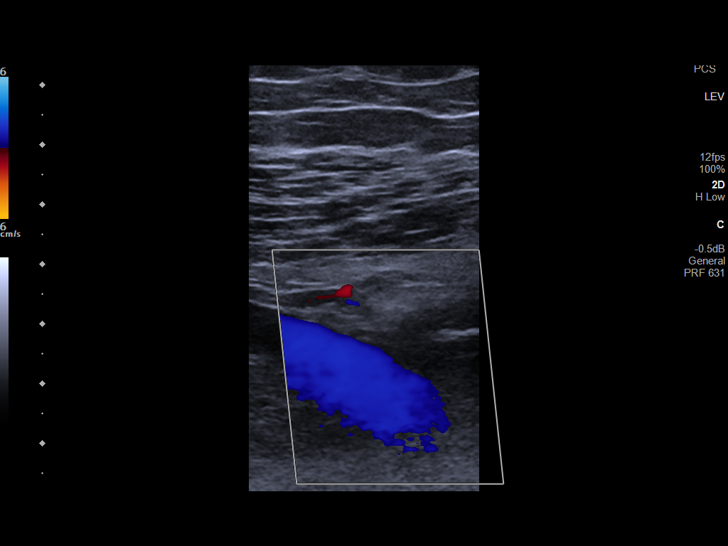
[im 57/63]
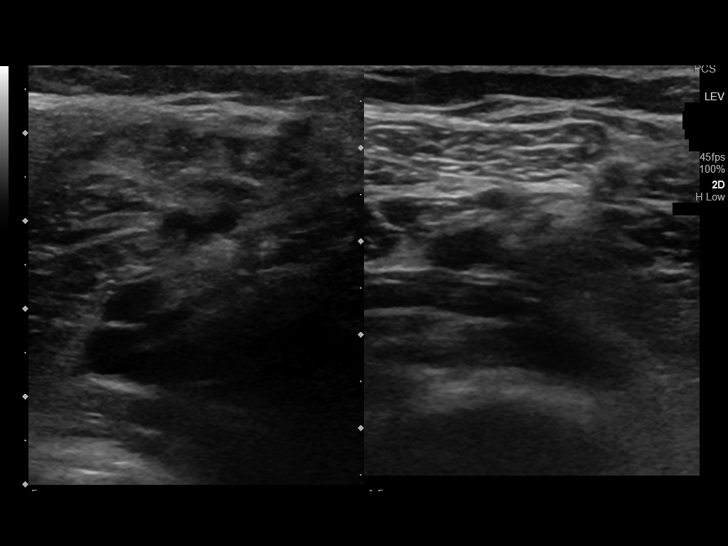
[im 63/63]
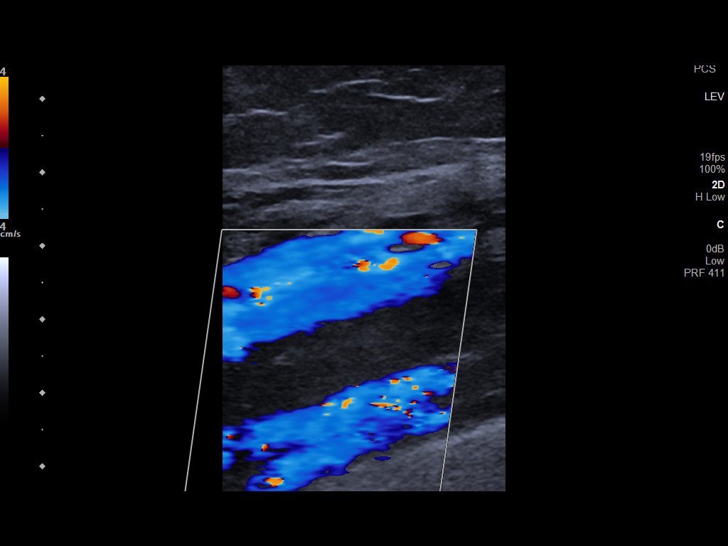

[14 of 24 positions shown; findings below may reference images not displayed]

FINDINGS: VENOUS

Normal compressibility of the common femoral, superficial femoral,
and popliteal veins, as well as the visualized calf veins.
Visualized portions of profunda femoral vein and great saphenous
vein unremarkable. No filling defects to suggest DVT on grayscale or
color Doppler imaging. Doppler waveforms show normal direction of
venous flow, normal respiratory plasticity and response to
augmentation.

Limited views of the contralateral common femoral vein are
unremarkable.

OTHER

None.

Limitations: none
IMPRESSION: Negative for DVT bilaterally.

## 2020-11-29 MED ORDER — APIXABAN 5 MG PO TABS: ORAL_TABLET | ORAL | 0 refills | Status: AC

## 2020-11-29 MED ORDER — TRAMADOL HCL 50 MG PO TABS: 50.0000 mg | ORAL_TABLET | Freq: Four times a day (QID) | ORAL | 0 refills | Status: AC | PRN

## 2020-11-29 MED ORDER — TRAMADOL HCL 50 MG PO TABS
50.0000 mg | ORAL_TABLET | Freq: Once | ORAL | Status: AC
Start: 2020-11-29 — End: 2020-11-29
  Administered 2020-11-29: 50 mg via ORAL
  Filled 2020-11-29: qty 1

## 2020-11-29 MED ORDER — IOHEXOL 350 MG/ML SOLN
75.0000 mL | Freq: Once | INTRAVENOUS | Status: AC | PRN
  Administered 2020-11-29: 75 mL via INTRAVENOUS
  Filled 2020-11-29: qty 75

## 2020-11-29 MED ORDER — APIXABAN 5 MG PO TABS
10.0000 mg | ORAL_TABLET | Freq: Once | ORAL | Status: AC
  Administered 2020-11-29: 10 mg via ORAL
  Filled 2020-11-29: qty 2

## 2020-11-29 MED ORDER — APIXABAN 5 MG PO TABS: 10.0000 mg | ORAL_TABLET | Freq: Two times a day (BID) | ORAL | Status: DC

## 2020-11-29 NOTE — ED Notes (Signed)
See triage note  presents with some SOB and rib pain  was seen at [facility] and sent to ED

## 2020-11-29 NOTE — Discharge Instructions (Addendum)
Take the Eliquis as prescribed.  We have supplied a 1 month course.  Follow-up with your primary care doctor within the next week.  You may continue extra strength Tylenol for pain, and use the tramadol only if needed for more severe pain.  Return to the ER immediately for new, worsening, or persistent severe chest or back pain, difficulty breathing, weakness or lightheadedness, or any other new or worsening symptoms that concern you.

## 2020-11-29 NOTE — ED Provider Notes (Signed)
[facility] Emergency Department Provider Note ____________________________________________   Event Date/Time   First MD Initiated Contact with Patient 11/29/20 1350     (approximate)  I have reviewed the triage vital signs and the nursing notes.   HISTORY  Chief Complaint Abnormal Lab    HPI MINEOLA DUAN is a 35 y.o. female with PMH as noted below who presents with left lower chest pain, acute onset several days ago, intermittent course since then, and mainly occurring when she takes a deep breath.  It is not reproduced with palpation.  She denies any associated shortness of breath and has no anterior chest pain.  She has no acute leg swelling, however she is in a knee brace and had an ACL repair in her right leg last month.  She was seen at the [facility]; a D-dimer obtained there was elevated so she was sent to the ED for further evaluation.  Past Medical History:  Diagnosis Date   Arthritis    Aspiration pneumonia (HCC)    Asthma    GERD (gastroesophageal reflux disease)     Patient Active Problem List   Diagnosis Date Noted   Activities involving baseball 10/28/2016   Acute respiratory failure (HCC) 10/28/2016   Sprain and strain of foot 10/28/2016   Striking against or struck accidentally by object in sports with subsequent fall 10/28/2016   Cough 08/20/2010   Allergic rhinitis, seasonal 05/09/2010   CHEST PAIN, ATYPICAL 03/06/2010   ASTHMA 09/20/2009   ASPIRATION PNEUMONIA 09/20/2009   G E R D 09/20/2009   Asthma 09/20/2009    Past Surgical History:  Procedure Laterality Date   ANTERIOR CRUCIATE LIGAMENT REPAIR Right 11/04/2020   Procedure: Right arthroscopic ACL reconstruction using quadriceps tendon autograft, medial meniscus repair of bucket handle tear, possible chondroplasty;  Surgeon: Signa Kell, MD;  Location: [facility] ORS;  Service: Orthopedics;  Laterality: Right;    Prior to Admission medications   Medication Sig  Start Date End Date Taking? Authorizing Provider  apixaban (ELIQUIS) 5 MG TABS tablet Take 2 tablets (10 mg total) by mouth 2 (two) times daily for 7 days, THEN 1 tablet (5 mg total) 2 (two) times daily for 21 days. 11/29/20 12/27/20 Yes Dionne Bucy, MD  traMADol (ULTRAM) 50 MG tablet Take 1 tablet (50 mg total) by mouth every 6 (six) hours as needed for up to 7 days for severe pain. 11/29/20 12/06/20 Yes Dionne Bucy, MD  acetaminophen (TYLENOL) 500 MG tablet Take 2 tablets (1,000 mg total) by mouth every 8 (eight) hours. 11/04/20 11/04/21  Signa Kell, MD  albuterol (PROVENTIL HFA;VENTOLIN HFA) 108 (90 BASE) MCG/ACT inhaler Inhale 2 puffs into the lungs every 6 (six) hours as needed for wheezing or shortness of breath. For breathing    [provider]  albuterol (PROVENTIL) (2.5 MG/3ML) 0.083% nebulizer solution USE 1 VIAL BY NEBULIZATION EVERY 4 (FOUR) HOURS AS NEEDED FOR WHEEZING. 08/23/14   Byrum, Les Pou, MD  BREO ELLIPTA 200-25 MCG/INH AEPB Inhale 1 puff into the lungs daily. 04/04/19   [provider]  diphenhydrAMINE (BENADRYL) 25 mg capsule Take 25 mg by mouth daily as needed for allergies.    [provider]  EPINEPHrine 0.3 mg/0.3 mL IJ SOAJ injection Inject 0.3 mg into the muscle as needed for anaphylaxis. 01/18/17   [provider]  gabapentin (NEURONTIN) 300 MG capsule Take 1 capsule (300 mg total) by mouth 3 (three) times daily for 5 days. 11/04/20 11/09/20  Signa Kell, MD  ondansetron (ZOFRAN ODT) 4 MG disintegrating tablet Take 1 tablet (4 mg total) by mouth every 8 (eight) hours as needed for nausea or vomiting. 11/04/20   Signa Kell, MD    Allergies Peanut oil, Ceftriaxone sodium, Fire ant, Penicillins, and Symbicort [budesonide-formoterol fumarate]  Family History  Problem Relation Age of Onset   Heart attack Maternal Grandmother    Asthma Father    Asthma Paternal Uncle    Thyroid disease Maternal Grandmother     Social  History Social History   Tobacco Use   Smoking status: Never   Smokeless tobacco: Never  Substance Use Topics   Alcohol use: No   Drug use: No    Review of Systems  Constitutional: No fever/chills Eyes: No visual changes. ENT: No sore throat. Cardiovascular: Positive for chest pain. Respiratory: Denies shortness of breath. Gastrointestinal: No nausea, no vomiting.  No diarrhea.  Genitourinary: Negative for dysuria.  Musculoskeletal: Negative for back pain. Skin: Negative for rash. Neurological: Negative for headaches, focal weakness or numbness.   ____________________________________________   PHYSICAL EXAM:  VITAL SIGNS: ED Triage Vitals  Enc Vitals Group     BP 11/29/20 1056 (!) 136/92     Pulse Rate 11/29/20 1056 96     Resp 11/29/20 1056 18     Temp 11/29/20 1056 98 F (36.7 C)     Temp src --      SpO2 11/29/20 1056 98 %     Weight 11/29/20 1346 228 lb 6.3 oz (103.6 kg)     Height 11/29/20 1346 5\' 4"  (1.626 m)     Head Circumference --      Peak Flow --      Pain Score 11/29/20 1053 6     Pain Loc --      Pain Edu? --      Excl. in GC? --     Constitutional: Alert and oriented. Well appearing and in no acute distress. Eyes: Conjunctivae are normal.  Head: Atraumatic. Nose: No congestion/rhinnorhea. Mouth/Throat: Mucous membranes are moist.   Neck: Normal range of motion.  Cardiovascular: Normal rate, regular rhythm. Grossly normal heart sounds.  Good peripheral circulation. Respiratory: Normal respiratory effort.  No retractions. Lungs CTAB. Gastrointestinal: No distention.  Musculoskeletal: No lower extremity edema.  Extremities warm and well perfused.  No chest wall or rib tenderness. Neurologic:  Normal speech and language. No gross focal neurologic deficits are appreciated.  Skin:  Skin is warm and dry. No rash noted. Psychiatric: Mood and affect are normal. Speech and behavior are normal.  ____________________________________________    LABS (all labs ordered are listed, but only abnormal results are displayed)  Labs Reviewed  BASIC METABOLIC PANEL - Abnormal; Notable for the following components:      Result Value   Glucose, Bld 100 (*)    All other components within normal limits  POC URINE PREG, ED   ____________________________________________  EKG   ____________________________________________  RADIOLOGY  CT angio chest:  IMPRESSION:  Examination is positive for nonocclusive pulmonary embolism  involving the left lower lobe pulmonary artery. Overall clot burden  is deemed very small in volume and there is no CT evidence of  right-sided heart strain or pulmonary infarct.     These results will be called to the ordering clinician or  representative by the Radiologist Assistant, and communication  documented in the PACS or 13/04/22.   [employer] venous LE bilateral:   IMPRESSION:  Negative for DVT bilaterally.    ____________________________________________  PROCEDURES  Procedure(s) performed: No  Procedures  Critical Care performed: No ____________________________________________   INITIAL IMPRESSION / ASSESSMENT AND PLAN / ED COURSE  Pertinent labs & imaging results that were available during my care of the patient were reviewed by me and considered in my medical decision making (see chart for details).   35 year old female with no PMH as noted above presents with very localized left lower chest wall pain which is pleuritic but not associated with shortness of breath or any other significant symptoms.  I reviewed the past medical records in Care Everywhere and Epic and confirmed that the patient had a D-dimer ordered today which was 1.9.  She was sent to the ED for further evaluation.  On exam the vital signs are normal, and exam is otherwise unremarkable with no significant tenderness to the area and no abnormal breath sounds.  Differential includes PE, musculoskeletal chest wall  pain, GERD/gastritis, or other benign etiology.  We will obtain a BMP and CT angio of the chest.  ----------------------------------------- 7:31 PM on 11/29/2020 -----------------------------------------  CT is positive for small segmental PEs in the left lower lobe consistent with the patient's location of pain.  I obtained bilateral lower extremity ultrasounds to rule out DVT, and these are negative.  The patient does not require admission.  She is low risk based on the Pulmonary Embolism Severity Index and Hestia criteria.  Her vital signs are normal, she does not require any oxygen, her pain is under control, and clot burden is very low.  I counseled the patient and her family member extensively on the results of the work-up and plan of care.  They agree with the treatment plan.  I will prescribe Eliquis and have the patient follow-up with her PMD.  I have also prescribed tramadol for pain.  Thorough return precautions given, and the patient expressed understanding.  ____________________________________________   FINAL CLINICAL IMPRESSION(S) / ED DIAGNOSES  Final diagnoses:  Acute pulmonary embolism without acute cor pulmonale, unspecified pulmonary embolism type (HCC)      NEW MEDICATIONS STARTED DURING THIS VISIT:  New Prescriptions   APIXABAN (ELIQUIS) 5 MG TABS TABLET    Take 2 tablets (10 mg total) by mouth 2 (two) times daily for 7 days, THEN 1 tablet (5 mg total) 2 (two) times daily for 21 days.   TRAMADOL (ULTRAM) 50 MG TABLET    Take 1 tablet (50 mg total) by mouth every 6 (six) hours as needed for up to 7 days for severe pain.     Note:  This document was prepared using Dragon voice recognition software and may include unintentional dictation errors.    Dionne Bucy, MD 11/29/20 [phone]

## 2020-11-29 NOTE — ED Triage Notes (Signed)
Pt come with c/o abnormal lab. Pt was seen at [facility] for some left upper rib pain. KC sent pt here for elevated D-Dimer and rule out clot.

## 2020-11-29 NOTE — ED Notes (Signed)
Pt. Preg was NEGATIVE.

## 2020-12-01 ENCOUNTER — Emergency Department
Admission: EM | Admit: 2020-12-01 | Discharge: 2020-12-02 | Disposition: A | Discharge status: Home / Self Care | Payer: BC Managed Care – PPO | Attending: Emergency Medicine | Admitting: Emergency Medicine

## 2020-12-01 ENCOUNTER — Emergency Department: Payer: BC Managed Care – PPO

## 2020-12-01 ENCOUNTER — Encounter: Payer: Self-pay | Admitting: Emergency Medicine

## 2020-12-01 DIAGNOSIS — J45909 Unspecified asthma, uncomplicated: Secondary | ICD-10-CM | POA: Insufficient documentation

## 2020-12-01 DIAGNOSIS — I2699 Other pulmonary embolism without acute cor pulmonale: Secondary | ICD-10-CM | POA: Insufficient documentation

## 2020-12-01 DIAGNOSIS — Z79899 Other long term (current) drug therapy: Secondary | ICD-10-CM | POA: Diagnosis not present

## 2020-12-01 DIAGNOSIS — Z7901 Long term (current) use of anticoagulants: Secondary | ICD-10-CM | POA: Insufficient documentation

## 2020-12-01 DIAGNOSIS — R079 Chest pain, unspecified: Secondary | ICD-10-CM | POA: Diagnosis present

## 2020-12-01 LAB — PROTIME-INR
INR: 1.5 — ABNORMAL HIGH (ref 0.8–1.2)
Prothrombin Time: 17.7 seconds — ABNORMAL HIGH (ref 11.4–15.2)

## 2020-12-01 LAB — BASIC METABOLIC PANEL
Anion gap: 10 (ref 5–15)
BUN: 11 mg/dL (ref 6–20)
CO2: 23 mmol/L (ref 22–32)
Calcium: 8.8 mg/dL — ABNORMAL LOW (ref 8.9–10.3)
Chloride: 102 mmol/L (ref 98–111)
Creatinine, Ser: 0.89 mg/dL (ref 0.44–1.00)
GFR, Estimated: >60 mL/min (ref >60)
Glucose, Bld: 125 mg/dL — ABNORMAL HIGH (ref 70–99)
Potassium: 3.6 mmol/L (ref 3.5–5.1)
Sodium: 135 mmol/L (ref 135–145)

## 2020-12-01 LAB — CBC
HCT: 36.4 % (ref 36.0–46.0)
Hemoglobin: 12 g/dL (ref 12.0–15.0)
MCH: 29.6 pg (ref 26.0–34.0)
MCHC: 33 g/dL (ref 30.0–36.0)
MCV: 89.7 fL (ref 80.0–100.0)
Platelets: 283 10*3/uL (ref 150–400)
RBC: 4.06 MIL/uL (ref 3.87–5.11)
RDW: 12.7 % (ref 11.5–15.5)
WBC: 9.7 10*3/uL (ref 4.0–10.5)
nRBC: 0 % (ref 0.0–0.2)

## 2020-12-01 LAB — APTT: aPTT: 39 seconds — ABNORMAL HIGH (ref 24–36)

## 2020-12-01 LAB — TROPONIN I (HIGH SENSITIVITY): Troponin I (High Sensitivity): <2 ng/L (ref <18)

## 2020-12-01 IMAGING — CT CT ANGIO CHEST
2 of 6 series · 17 of 46 positions shown · IV contrast (APPLIED)
Comparison: [DATE]

CLINICAL DATA: Left-sided chest pain and history of pulmonary
embolism on the left.

EXAM:
CT ANGIOGRAPHY CHEST WITH CONTRAST
TECHNIQUE: Multidetector CT imaging of the chest was performed using the
standard protocol during bolus administration of intravenous
contrast. Multiplanar CT image reconstructions and MIPs were
obtained to evaluate the vascular anatomy.
CONTRAST:  75mL OMNIPAQUE IOHEXOL 350 MG/ML SOLN

[Series 5: thins · axial · 0.67mm/px · z∈[-492,-296]mm · 14 of 269 slices shown]
[im 12/269  lung]
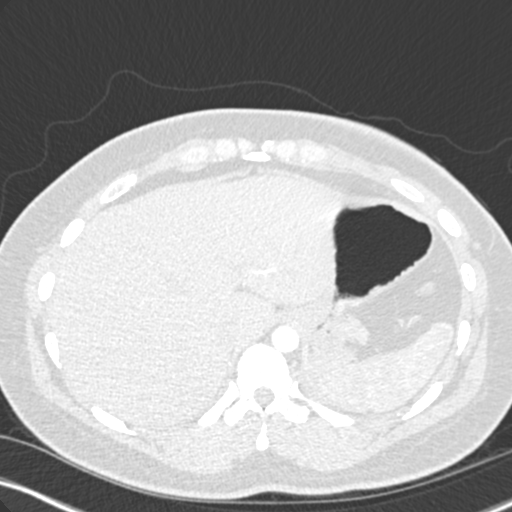
[im 34/269  soft-tissue]
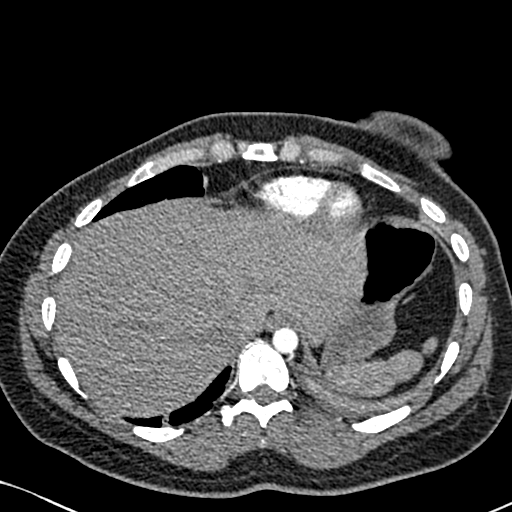
[im 56/269  lung]
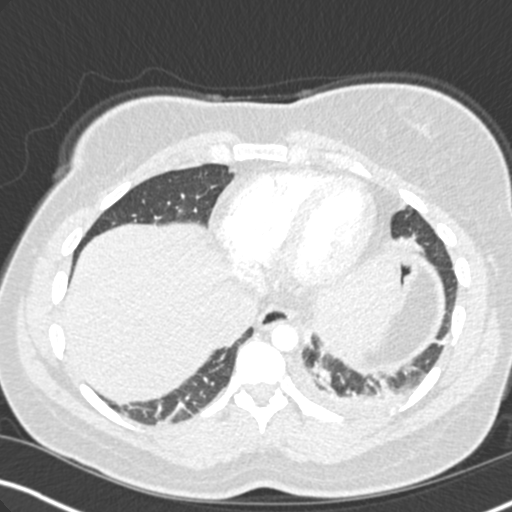
[im 68/269  soft-tissue]
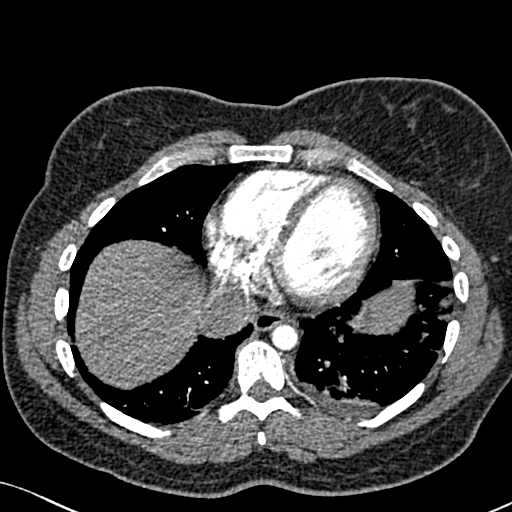
[im 90/269  lung]
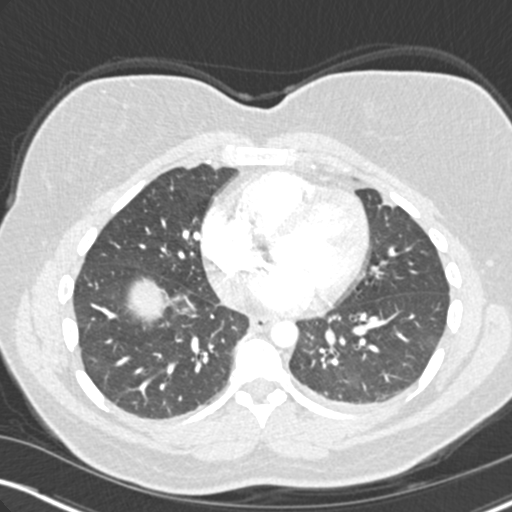
[im 112/269  soft-tissue]
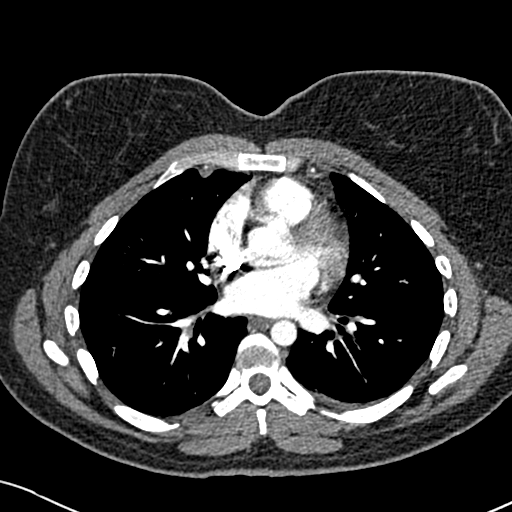
[im 123/269  lung]
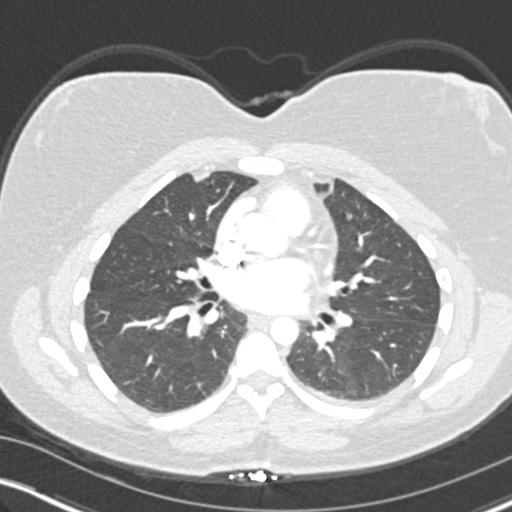
[im 146/269  soft-tissue]
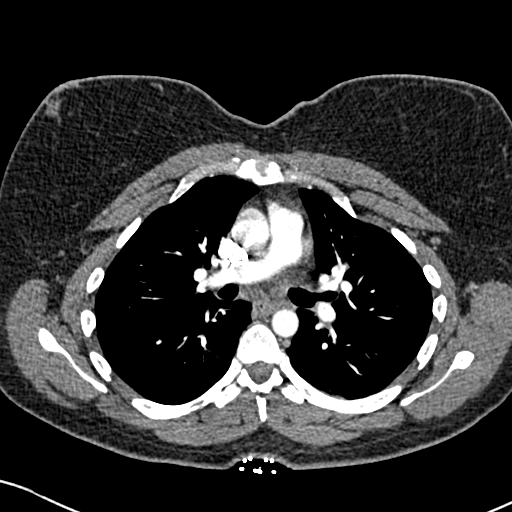
[im 157/269  lung]
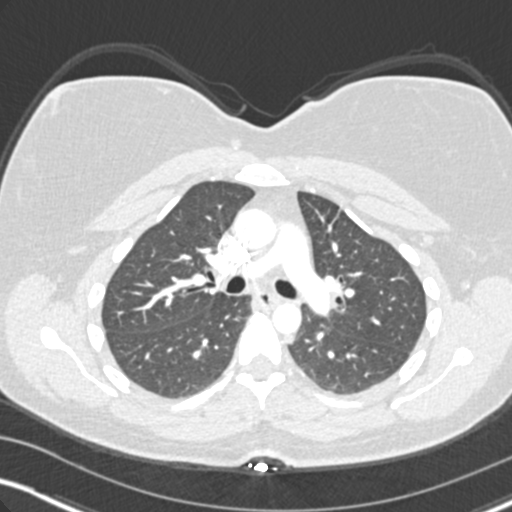
[im 179/269  soft-tissue]
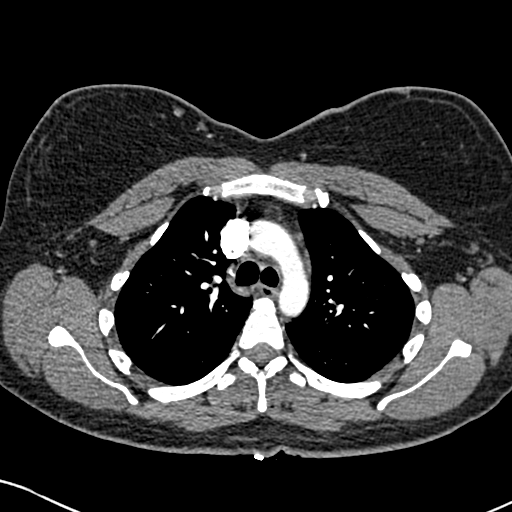
[im 202/269  lung]
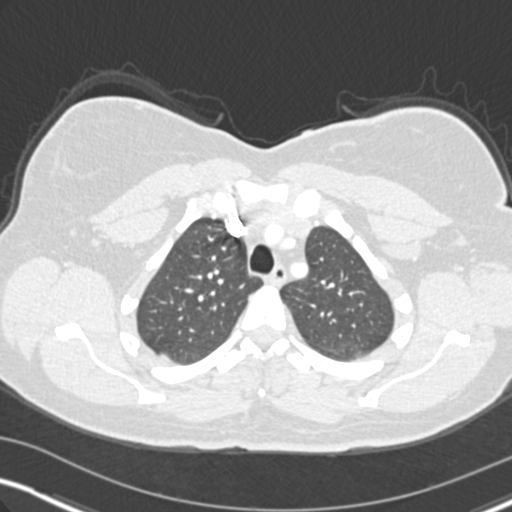
[im 213/269  soft-tissue]
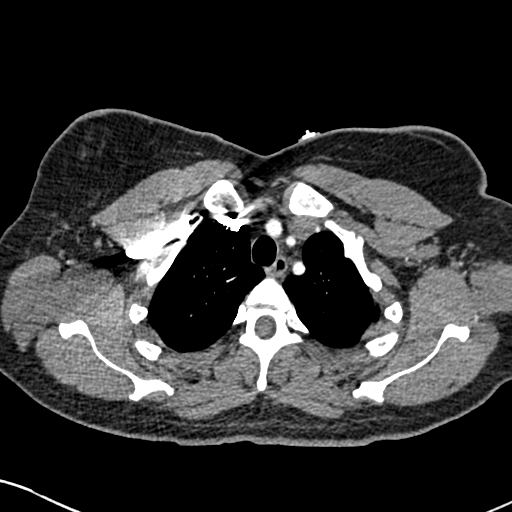
[im 235/269  lung]
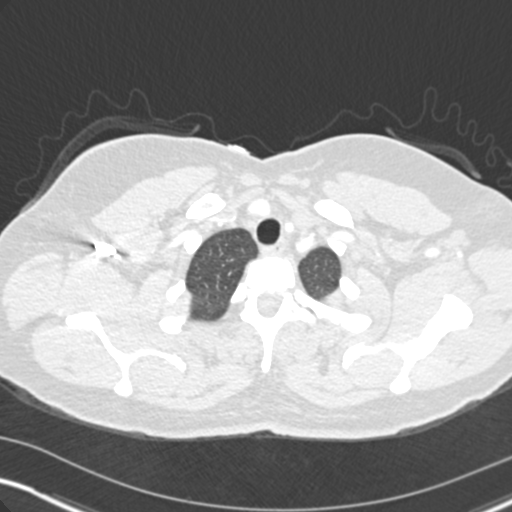
[im 257/269  soft-tissue]
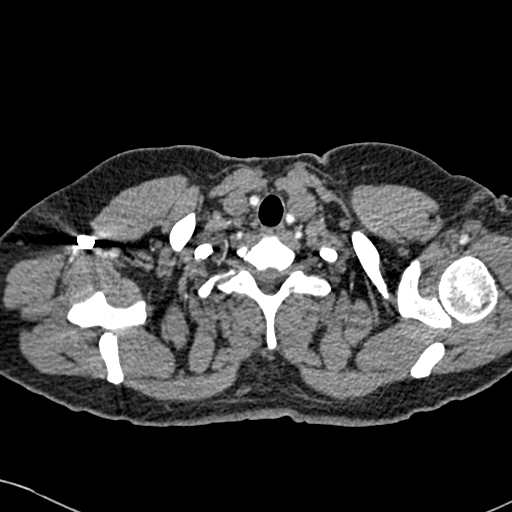

[Series 7: coronal mpr · coronal · 0.44mm/px · 3 of 92 slices shown]
[im 23/92  soft-tissue]
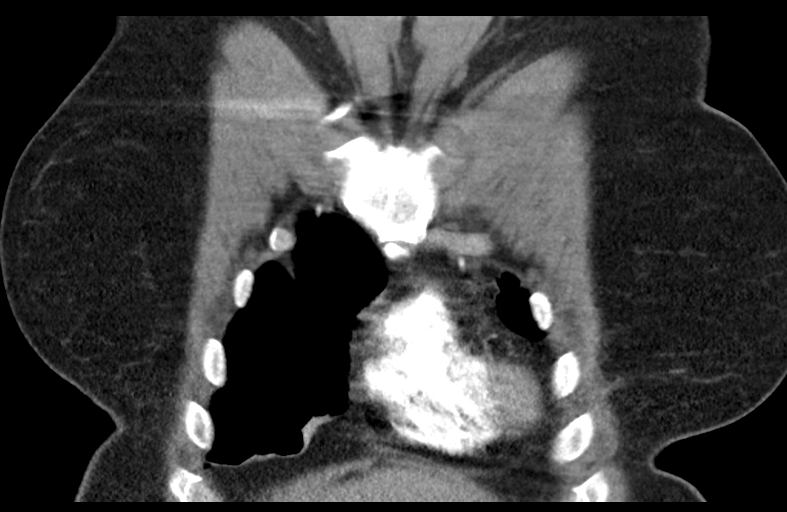
[im 46/92  soft-tissue]
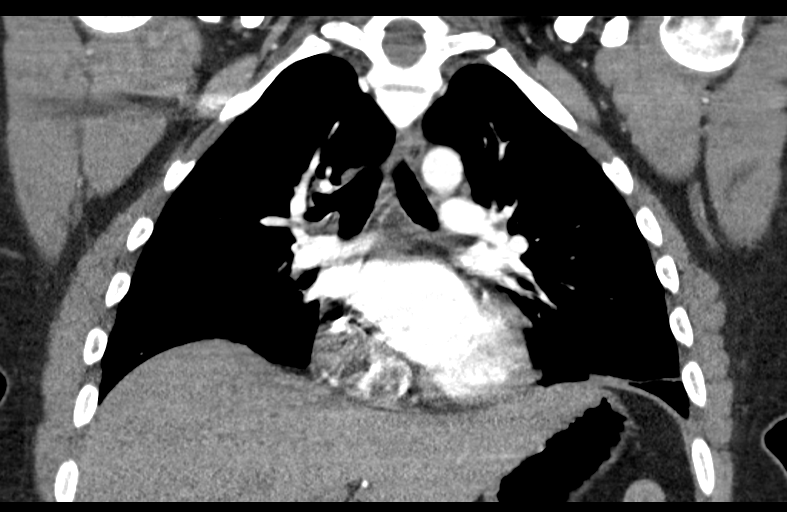
[im 69/92  soft-tissue]
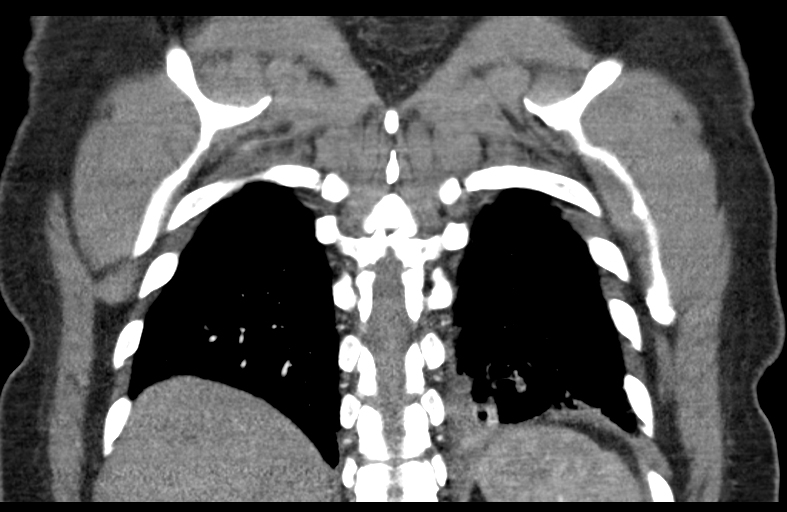

[17 of 46 positions shown; findings below may reference images not displayed]

FINDINGS: Cardiovascular: There again noted nonocclusive filling defects
identified in the left lower lobe pulmonary arterial branches
similar to that seen on the prior exam. No new filling defects are
identified to suggest progressive pulmonary embolism. No cardiac
enlargement is seen. No right heart strain is seen. The thoracic
aorta shows a normal branching pattern without aneurysmal dilatation
or dissection.

Mediastinum/Nodes: Thoracic inlet is within normal limits. No
sizable hilar or mediastinal adenopathy is noted. The esophagus as
visualized is within normal limits.

Lungs/Pleura: Lungs are well aerated bilaterally. Mild left basilar
atelectasis is seen slightly worsened when compared with the prior
exam. No sizable effusion is noted. No pneumothorax is seen.

Upper Abdomen: Visualized upper abdomen is unremarkable.

Musculoskeletal: No acute rib abnormality is noted. No compression
deformity is seen.

Review of the MIP images confirms the above findings.
IMPRESSION: Persistent left lower lobe pulmonary emboli stable in appearance
from the prior exam. Minimal associated lower lobe atelectasis is
noted. No right heart strain or progressive emboli are seen.

## 2020-12-01 MED ORDER — IOHEXOL 350 MG/ML SOLN
75.0000 mL | Freq: Once | INTRAVENOUS | Status: AC | PRN
  Administered 2020-12-01: 75 mL via INTRAVENOUS

## 2020-12-01 NOTE — ED Triage Notes (Signed)
Pt to ED due to worsening pain to the left side of chest from previous diagnosed PE on Friday in ED. Pt on Eliquis. Pt reports pain unbearable and unable to take deep breath.

## 2020-12-02 LAB — TROPONIN I (HIGH SENSITIVITY): Troponin I (High Sensitivity): <2 ng/L (ref <18)

## 2020-12-02 MED ORDER — ONDANSETRON 4 MG PO TBDP: 4.0000 mg | ORAL_TABLET | Freq: Four times a day (QID) | ORAL | 0 refills | Status: DC | PRN

## 2020-12-02 NOTE — ED Provider Notes (Signed)
[facility] Emergency Department Provider Note  ____________________________________________   Event Date/Time   First MD Initiated Contact with Patient 12/02/20 [phone]     (approximate)  I have reviewed the triage vital signs and the nursing notes.   HISTORY  Chief Complaint Chest Pain    HPI Jaclyn Mitchell is a 35 y.o. female with a recent diagnosis of left lower lobe pulmonary embolus who presents to the emergency department with continued left-sided chest pain.  States that during her last ED visit on 11/29/2020 she was given a prescription for tramadol.  She does not feel this is controlling her pain.  Mother reports that she does have oxycodone at home from her recent right knee surgery but has not been taking this.  She has had some nausea but no vomiting.  No fevers, cough, shortness of breath.  She did have bilateral lower extremity Dopplers while in the ED that were unremarkable.        Past Medical History:  Diagnosis Date   Arthritis    Aspiration pneumonia (HCC)    Asthma    GERD (gastroesophageal reflux disease)     Patient Active Problem List   Diagnosis Date Noted   Activities involving baseball 10/28/2016   Acute respiratory failure (HCC) 10/28/2016   Sprain and strain of foot 10/28/2016   Striking against or struck accidentally by object in sports with subsequent fall 10/28/2016   Cough 08/20/2010   Allergic rhinitis, seasonal 05/09/2010   CHEST PAIN, ATYPICAL 03/06/2010   ASTHMA 09/20/2009   ASPIRATION PNEUMONIA 09/20/2009   G E R D 09/20/2009   Asthma 09/20/2009    Past Surgical History:  Procedure Laterality Date   ANTERIOR CRUCIATE LIGAMENT REPAIR Right 11/04/2020   Procedure: Right arthroscopic ACL reconstruction using quadriceps tendon autograft, medial meniscus repair of bucket handle tear, possible chondroplasty;  Surgeon: Signa Kell, MD;  Location: [facility] ORS;  Service: Orthopedics;  Laterality: Right;    Prior  to Admission medications   Medication Sig Start Date End Date Taking? Authorizing Provider  ondansetron (ZOFRAN ODT) 4 MG disintegrating tablet Take 1 tablet (4 mg total) by mouth every 6 (six) hours as needed for nausea or vomiting. 12/02/20  Yes Mirela Parsley, Layla Maw, DO  acetaminophen (TYLENOL) 500 MG tablet Take 2 tablets (1,000 mg total) by mouth every 8 (eight) hours. 11/04/20 11/04/21  Signa Kell, MD  albuterol (PROVENTIL HFA;VENTOLIN HFA) 108 (90 BASE) MCG/ACT inhaler Inhale 2 puffs into the lungs every 6 (six) hours as needed for wheezing or shortness of breath. For breathing    [provider]  albuterol (PROVENTIL) (2.5 MG/3ML) 0.083% nebulizer solution USE 1 VIAL BY NEBULIZATION EVERY 4 (FOUR) HOURS AS NEEDED FOR WHEEZING. 08/23/14   Byrum, Les Pou, MD  apixaban (ELIQUIS) 5 MG TABS tablet Take 2 tablets (10 mg total) by mouth 2 (two) times daily for 7 days, THEN 1 tablet (5 mg total) 2 (two) times daily for 21 days. 11/29/20 12/27/20  Dionne Bucy, MD  BREO ELLIPTA 200-25 MCG/INH AEPB Inhale 1 puff into the lungs daily. 04/04/19   [provider]  diphenhydrAMINE (BENADRYL) 25 mg capsule Take 25 mg by mouth daily as needed for allergies.    [provider]  EPINEPHrine 0.3 mg/0.3 mL IJ SOAJ injection Inject 0.3 mg into the muscle as needed for anaphylaxis. 01/18/17   [provider]  gabapentin (NEURONTIN) 300 MG capsule Take 1 capsule (300 mg total) by mouth 3 (three) times daily for 5  days. 11/04/20 11/09/20  Signa Kell, MD  traMADol (ULTRAM) 50 MG tablet Take 1 tablet (50 mg total) by mouth every 6 (six) hours as needed for up to 7 days for severe pain. 11/29/20 12/06/20  Dionne Bucy, MD    Allergies Peanut oil, Ceftriaxone sodium, Fire ant, Penicillins, and Symbicort [budesonide-formoterol fumarate]  Family History  Problem Relation Age of Onset   Heart attack Maternal Grandmother    Asthma Father    Asthma Paternal Uncle    Thyroid  disease Maternal Grandmother     Social History Social History   Tobacco Use   Smoking status: Never   Smokeless tobacco: Never  Substance Use Topics   Alcohol use: No   Drug use: No    Review of Systems Constitutional: No fever. Eyes: No visual changes. ENT: No sore throat. Cardiovascular: + chest pain. Respiratory: Denies shortness of breath. Gastrointestinal: No vomiting, diarrhea. Genitourinary: Negative for dysuria. Musculoskeletal: Negative for back pain. Skin: Negative for rash. Neurological: Negative for focal weakness or numbness.  ____________________________________________   PHYSICAL EXAM:  VITAL SIGNS: ED Triage Vitals [12/01/20 2128]  Enc Vitals Group     BP 111/79     Pulse Rate 98     Resp 16     Temp 98.6 F (37 C)     Temp Source Oral     SpO2 99 %     Weight      Height      Head Circumference      Peak Flow      Pain Score      Pain Loc      Pain Edu?      Excl. in GC?    CONSTITUTIONAL: Alert and oriented and responds appropriately to questions.  Appears uncomfortable, afebrile and nontoxic HEAD: Normocephalic EYES: Conjunctivae clear, pupils appear equal, EOM appear intact ENT: normal nose; moist mucous membranes NECK: Supple, normal ROM CARD: RRR; S1 and S2 appreciated; no murmurs, no clicks, no rubs, no gallops RESP: Normal chest excursion without splinting or tachypnea; breath sounds clear and equal bilaterally; no wheezes, no rhonchi, no rales, no hypoxia or respiratory distress, speaking full sentences ABD/GI: Normal bowel sounds; non-distended; soft, non-tender, no rebound, no guarding, no peritoneal signs, no hepatosplenomegaly BACK: The back appears normal EXT: Normal ROM in all joints; no deformity noted, no edema; no cyanosis; right lower extremity is in a knee immobilizer SKIN: Normal color for age and race; warm; no rash on exposed skin NEURO: Moves all extremities equally PSYCH: The patient's mood and manner are  appropriate.  ____________________________________________   LABS (all labs ordered are listed, but only abnormal results are displayed)  Labs Reviewed  BASIC METABOLIC PANEL - Abnormal; Notable for the following components:      Result Value   Glucose, Bld 125 (*)    Calcium 8.8 (*)    All other components within normal limits  PROTIME-INR - Abnormal; Notable for the following components:   Prothrombin Time 17.7 (*)    INR 1.5 (*)    All other components within normal limits  APTT - Abnormal; Notable for the following components:   aPTT 39 (*)    All other components within normal limits  CBC  TROPONIN I (HIGH SENSITIVITY)  TROPONIN I (HIGH SENSITIVITY)   ____________________________________________  EKG   EKG Interpretation  Date/Time:  Sunday December 01 2020 21:35:01 EST Ventricular Rate:  91 PR Interval:  152 QRS Duration: 84 QT Interval:  342 QTC Calculation: 420 R  Axis:   26 Text Interpretation: Normal sinus rhythm Normal ECG Confirmed by Rochele Raring [phone]) on 12/02/2020 6:47:58 AM        ____________________________________________  RADIOLOGY Normajean Baxter Raylin Diguglielmo, personally viewed and evaluated these images (plain radiographs) as part of my medical decision making, as well as reviewing the written report by the radiologist.  ED MD interpretation: CTA shows stable left lower lobe embolus.  Official radiology report(s): CT Angio Chest PE W and/or Wo Contrast  Result Date: 12/01/2020 CLINICAL DATA:  Left-sided chest pain and history of pulmonary embolism on the left. EXAM: CT ANGIOGRAPHY CHEST WITH CONTRAST TECHNIQUE: Multidetector CT imaging of the chest was performed using the standard protocol during bolus administration of intravenous contrast. Multiplanar CT image reconstructions and MIPs were obtained to evaluate the vascular anatomy. CONTRAST:  5mL OMNIPAQUE IOHEXOL 350 MG/ML SOLN COMPARISON:  11/29/2020 FINDINGS: Cardiovascular: There again noted  nonocclusive filling defects identified in the left lower lobe pulmonary arterial branches similar to that seen on the prior exam. No new filling defects are identified to suggest progressive pulmonary embolism. No cardiac enlargement is seen. No right heart strain is seen. The thoracic aorta shows a normal branching pattern without aneurysmal dilatation or dissection. Mediastinum/Nodes: Thoracic inlet is within normal limits. No sizable hilar or mediastinal adenopathy is noted. The esophagus as visualized is within normal limits. Lungs/Pleura: Lungs are well aerated bilaterally. Mild left basilar atelectasis is seen slightly worsened when compared with the prior exam. No sizable effusion is noted. No pneumothorax is seen. Upper Abdomen: Visualized upper abdomen is unremarkable. Musculoskeletal: No acute rib abnormality is noted. No compression deformity is seen. Review of the MIP images confirms the above findings. IMPRESSION: Persistent left lower lobe pulmonary emboli stable in appearance from the prior exam. Minimal associated lower lobe atelectasis is noted. No right heart strain or progressive emboli are seen. Electronically Signed   By: Alcide Clever M.D.   On: 12/01/2020 22:27    ____________________________________________   PROCEDURES  Procedure(s) performed (including Critical Care):  Procedures   ____________________________________________   INITIAL IMPRESSION / ASSESSMENT AND PLAN / ED COURSE  As part of my medical decision making, I reviewed the following data within the electronic MEDICAL RECORD NUMBER History obtained from family, Nursing notes reviewed and incorporated, Labs reviewed , EKG interpreted , Old EKG reviewed, Old chart reviewed, CT reviewed, Notes from prior ED visits, and [place] Controlled Substance Database         Patient here with continued chest pain after being recently diagnosed with a pulmonary embolus.  Repeat CT imaging was obtained from triage that shows  persistent nonocclusive left lower lobe pulmonary emboli with no change compared to previous.  She has no sign of pulmonary infarction, right heart strain or new emboli.  Symptoms seem very atypical for ACS.  EKG is nonischemic without arrhythmia, interval abnormality.  Troponin x2 negative.  She feels the tramadol at home is not helping her pain.  She states she does have a prescription for oxycodone.  Mother and patient states they feel she has enough of this medication.  Recommended that she start taking oxycodone for further pain control.  She is on Eliquis and reports compliance.  Advised her to continue this medication.  She did have bilateral venous Dopplers which showed no DVT.  Have offered her pain and nausea medicine here multiple times which she declined stating she would just like to go home.  Have offered to give her another prescription for more pain medication but  she states she feels she has plenty.  Will provide with a prescription for nausea medicine.  She has not hypoxic here.  She is not in respiratory distress.  I do not feel at this time she needs admission.  Discussed return precautions and supportive care instructions.  Patient and mother comfortable with this plan.  At this time, I do not feel there is any life-threatening condition present. I have reviewed, interpreted and discussed all results (EKG, imaging, lab, urine as appropriate) and exam findings with patient/family. I have reviewed nursing notes and appropriate previous records.  I feel the patient is safe to be discharged home without further emergent workup and can continue workup as an outpatient as needed. Discussed usual and customary return precautions. Patient/family verbalize understanding and are comfortable with this plan.  Outpatient follow-up has been provided as needed. All questions have been answered.  ____________________________________________   FINAL CLINICAL IMPRESSION(S) / ED DIAGNOSES  Final  diagnoses:  Pulmonary embolism on left [facility])     ED Discharge Orders          Ordered    ondansetron (ZOFRAN ODT) 4 MG disintegrating tablet  Every 6 hours PRN        12/02/20 0614            *Please note:  Jaclyn Mitchell was evaluated in Emergency Department on 12/02/2020 for the symptoms described in the history of present illness. She was evaluated in the context of the global COVID-19 pandemic, which necessitated consideration that the patient might be at risk for infection with the SARS-CoV-2 virus that causes COVID-19. Institutional protocols and algorithms that pertain to the evaluation of patients at risk for COVID-19 are in a state of rapid change based on information released by regulatory bodies including the CDC and federal and state organizations. These policies and algorithms were followed during the patient's care in the ED.  Some ED evaluations and interventions may be delayed as a result of limited staffing during and the pandemic.*   Note:  This document was prepared using Dragon voice recognition software and may include unintentional dictation errors.    Quame Spratlin, Layla Maw, DO 12/02/20 [phone]

## 2020-12-02 NOTE — Discharge Instructions (Signed)
Please continue your blood thinners as prescribed.  You may take 10 mg of oxycodone every 6 hours as needed for pain.  I recommend that you take a stool softener such as Colace 100 mg twice a day or a laxative such as MiraLAX 17 g twice a day to prevent constipation while on pain medication.  Your labs, EKG today were reassuring your CT scan showed no new blood clots, pneumonia, pulmonary edema or pulmonary infarction.

## 2021-03-12 ENCOUNTER — Other Ambulatory Visit: Payer: Self-pay | Admitting: Orthopedic Surgery

## 2021-03-12 ENCOUNTER — Other Ambulatory Visit (HOSPITAL_COMMUNITY): Payer: Self-pay | Admitting: Orthopedic Surgery

## 2021-03-12 DIAGNOSIS — S83211A Bucket-handle tear of medial meniscus, current injury, right knee, initial encounter: Secondary | ICD-10-CM

## 2021-03-12 DIAGNOSIS — S83511A Sprain of anterior cruciate ligament of right knee, initial encounter: Secondary | ICD-10-CM

## 2021-03-16 ENCOUNTER — Other Ambulatory Visit: Payer: Self-pay

## 2021-03-16 ENCOUNTER — Ambulatory Visit
Admission: RE | Admit: 2021-03-16 | Discharge: 2021-03-16 | Disposition: A | Discharge status: Home / Self Care | Payer: BC Managed Care – PPO | Source: Ambulatory Visit | Attending: Orthopedic Surgery | Admitting: Orthopedic Surgery

## 2021-03-16 DIAGNOSIS — S83211A Bucket-handle tear of medial meniscus, current injury, right knee, initial encounter: Secondary | ICD-10-CM | POA: Diagnosis present

## 2021-03-16 DIAGNOSIS — S83511A Sprain of anterior cruciate ligament of right knee, initial encounter: Secondary | ICD-10-CM | POA: Diagnosis present

## 2021-03-16 IMAGING — MR MR KNEE*R* W/O CM
7 series · 40 of 40 positions shown · non-contrast
Comparison: None.

CLINICAL DATA: Right knee pain

EXAM:
MRI OF THE RIGHT KNEE WITHOUT CONTRAST
TECHNIQUE: Multiplanar, multisequence MR imaging of the knee was performed. No
intravenous contrast was administered.

[Series 8: T2 fat-sat · axial · right · 4.0mm · 0.50mm/px · z∈[-72,+72]mm · 6 of 30 slices shown (1 of 3)]
[im 1/30]
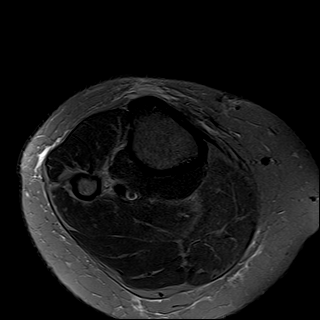
[im 6/30]
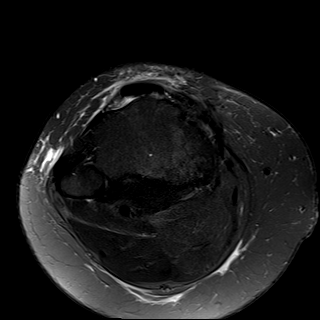
[im 12/30]
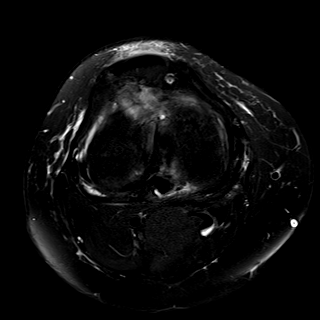
[im 18/30]
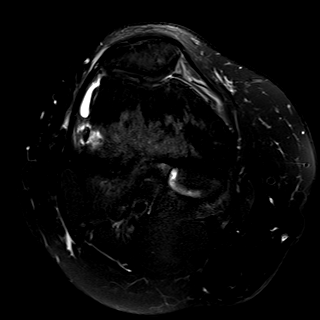
[im 24/30]
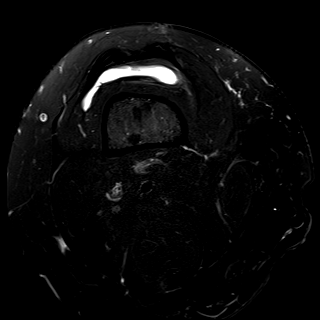
[im 30/30]
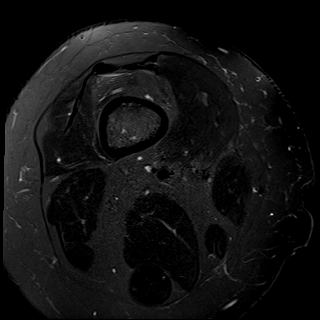

[Series 9: T2 fat-sat · coronal · right · 4.0mm · 0.59mm/px · 6 of 29 slices shown (2 of 3)]
[im 1/29]
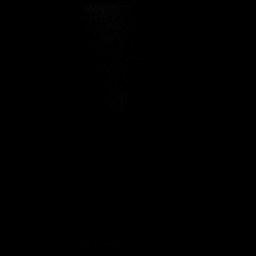
[im 6/29]
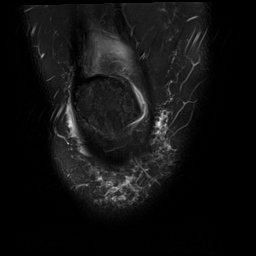
[im 12/29]
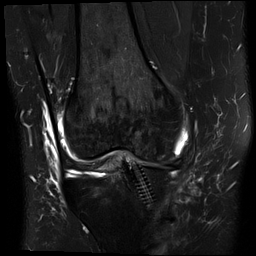
[im 17/29]
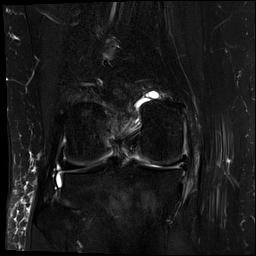
[im 23/29]
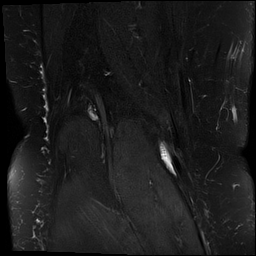
[im 29/29]
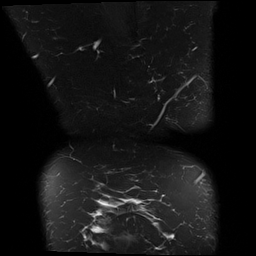

[Series 10: T1 · coronal · right · 4.0mm · 0.59mm/px · 6 of 30 slices shown]
[im 1/30]
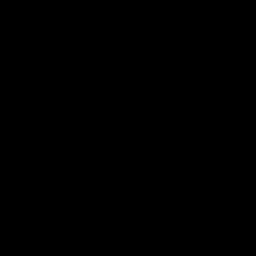
[im 6/30]
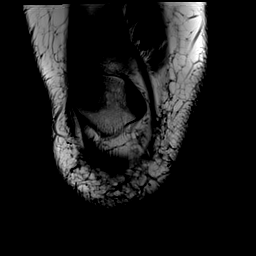
[im 12/30]
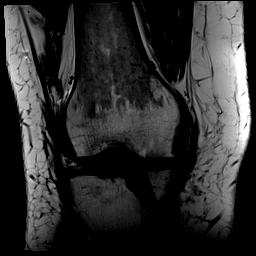
[im 18/30]
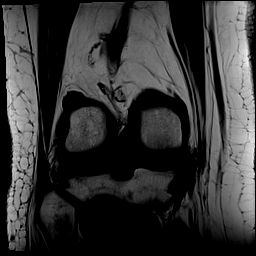
[im 24/30]
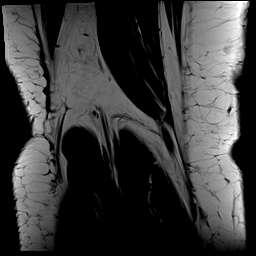
[im 30/30]
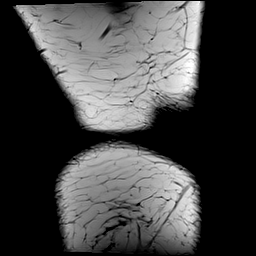

[Series 11: PD fat-sat · sagittal · right · 3.0mm · 0.59mm/px · 6 of 31 slices shown (1 of 2)]
[im 1/31]
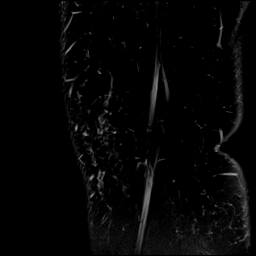
[im 7/31]
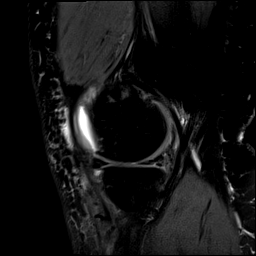
[im 13/31]
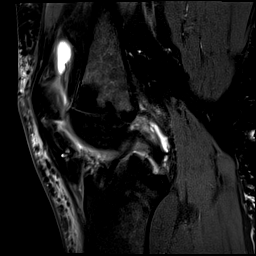
[im 19/31]
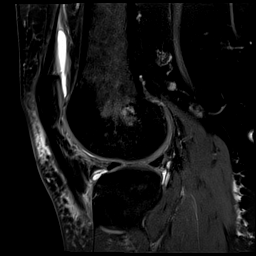
[im 25/31]
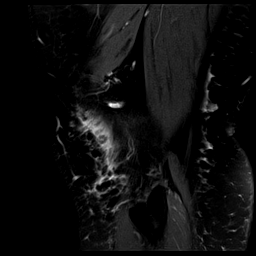
[im 31/31]
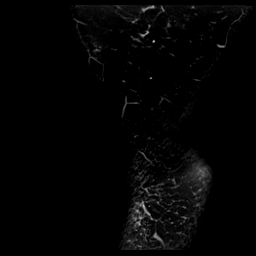

[Series 12: PD fat-sat · coronal · right · 4.0mm · 0.59mm/px · 6 of 30 slices shown (2 of 2)]
[im 1/30]
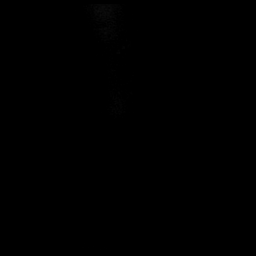
[im 6/30]
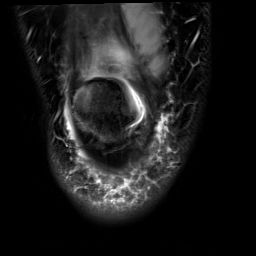
[im 12/30]
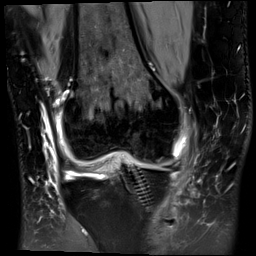
[im 18/30]
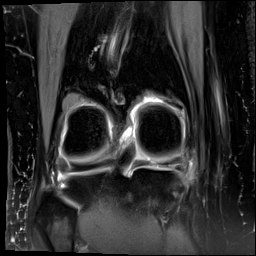
[im 24/30]
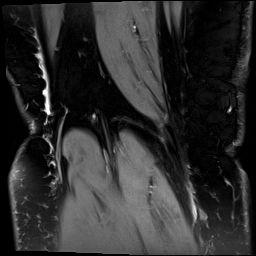
[im 30/30]
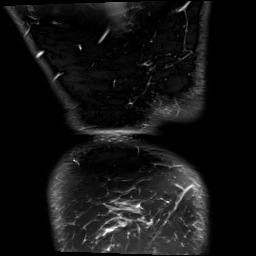

[Series 13: T2 fat-sat · sagittal · right · 3.0mm · 0.59mm/px · 7 of 35 slices shown (3 of 3)]
[im 1/35]
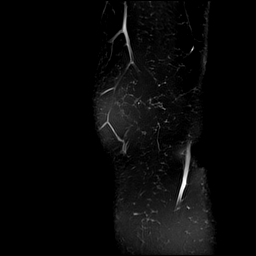
[im 6/35]
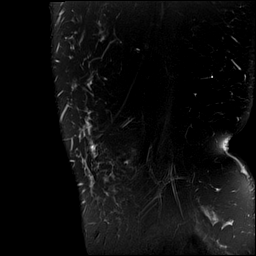
[im 12/35]
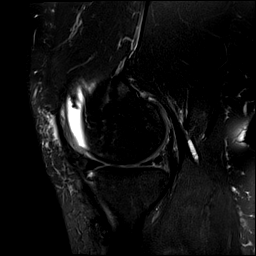
[im 18/35]
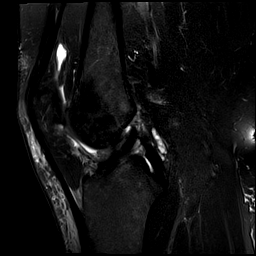
[im 23/35]
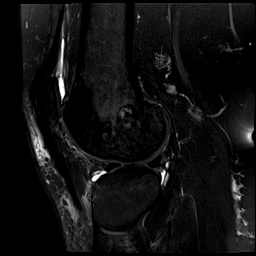
[im 29/35]
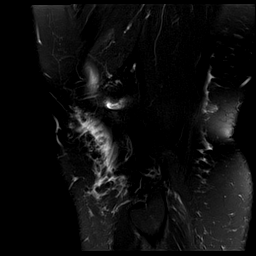
[im 35/35]
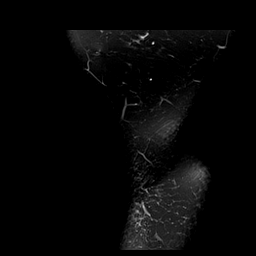

[Series 14: PD · coronal · right · 2.0mm · 0.47mm/px · 3 of 16 slices shown]
[im 1/16]
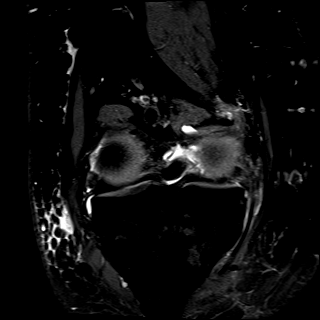
[im 8/16]
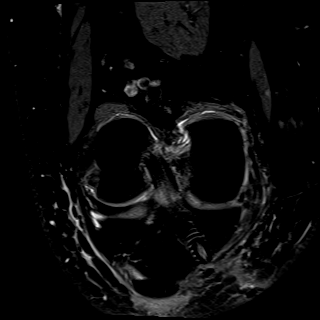
[im 16/16]
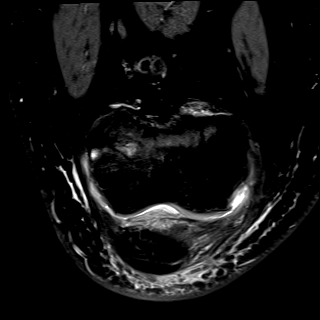

[40 of 40 positions shown; findings below may reference images not displayed]

FINDINGS: MENISCI

Medial: Evidence of prior medial meniscus repair (previously had a
bucket-handle tear). There is multidirectional intermediate and high
intensity signal in the posterior horn and body extending towards
the anterior horn.

Lateral: Intact lateral meniscus.

LIGAMENTS

Cruciates: Prior ACL reconstruction.  The ACL graft appears intact.

Collaterals: Medial collateral ligament is intact. Lateral
collateral ligament complex is intact.

CARTILAGE

Patellofemoral:  Mild chondrosis.

Medial: Mild chondrosis. Tiny 2 mm intermediate grade chondral
defect along the weight-bearing medial femoral condyle.

Lateral:  Mild chondrosis.

JOINT: Small joint effusion. There is edema and scarring within
Hoffa's fat anterior to the ACL attachment.

POPLITEAL FOSSA: Small Baker cyst.

EXTENSOR MECHANISM: Intact quadriceps tendon. Intact patellar
tendon.

BONES: No acute fracture or dislocation. No aggressive osseous
lesion. There is mild osteophyte formation noted in the medial and
patellofemoral compartments.

Other: No fluid collection or hematoma. Muscles are normal.
IMPRESSION: Prior ACL reconstruction with intact ACL graft.

Scarring and edema signal within Hoffa's fat hip pad, either
reactive or suggestive of a degree of impingement anteriorly.

Prior medial meniscus repair with evidence of nondisplaced recurrent
tear through the posterior horn and body.

Mild tricompartment degenerative arthritis.

Small joint effusion and Baker cyst.

## 2021-03-19 ENCOUNTER — Other Ambulatory Visit: Payer: Self-pay | Admitting: Orthopedic Surgery

## 2021-03-19 ENCOUNTER — Ambulatory Visit
Admission: RE | Admit: 2021-03-19 | Discharge: 2021-03-19 | Disposition: A | Discharge status: Home / Self Care | Payer: BC Managed Care – PPO | Source: Ambulatory Visit | Attending: Orthopedic Surgery | Admitting: Orthopedic Surgery

## 2021-03-19 ENCOUNTER — Ambulatory Visit
Admission: RE | Admit: 2021-03-19 | Discharge: 2021-03-19 | Disposition: A | Discharge status: Home / Self Care | Payer: BC Managed Care – PPO | Attending: Orthopedic Surgery | Admitting: Orthopedic Surgery

## 2021-03-19 DIAGNOSIS — M25569 Pain in unspecified knee: Secondary | ICD-10-CM

## 2021-03-19 IMAGING — CR DG BONE LENGTH
1 series · 4 of 4 positions shown · non-contrast
Comparison: None.

CLINICAL DATA: Evaluate mechanical alignment of the knees.

EXAM:
BONE LENGTH

[Series 1: long bone ap · 0.14mm/px · 4 of 4 slices shown]
[im 1/4]
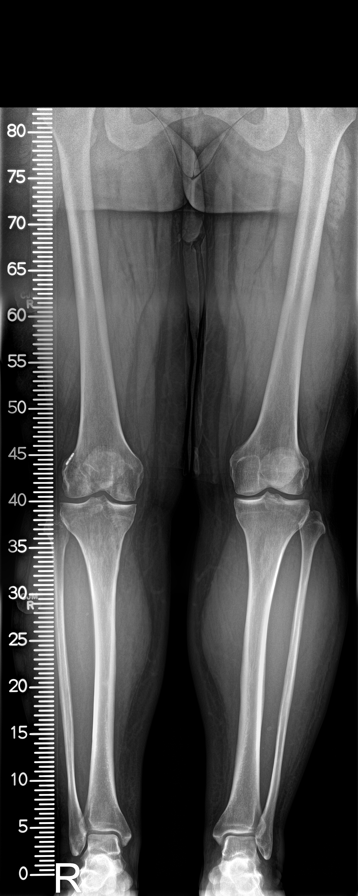
[im 2/4]
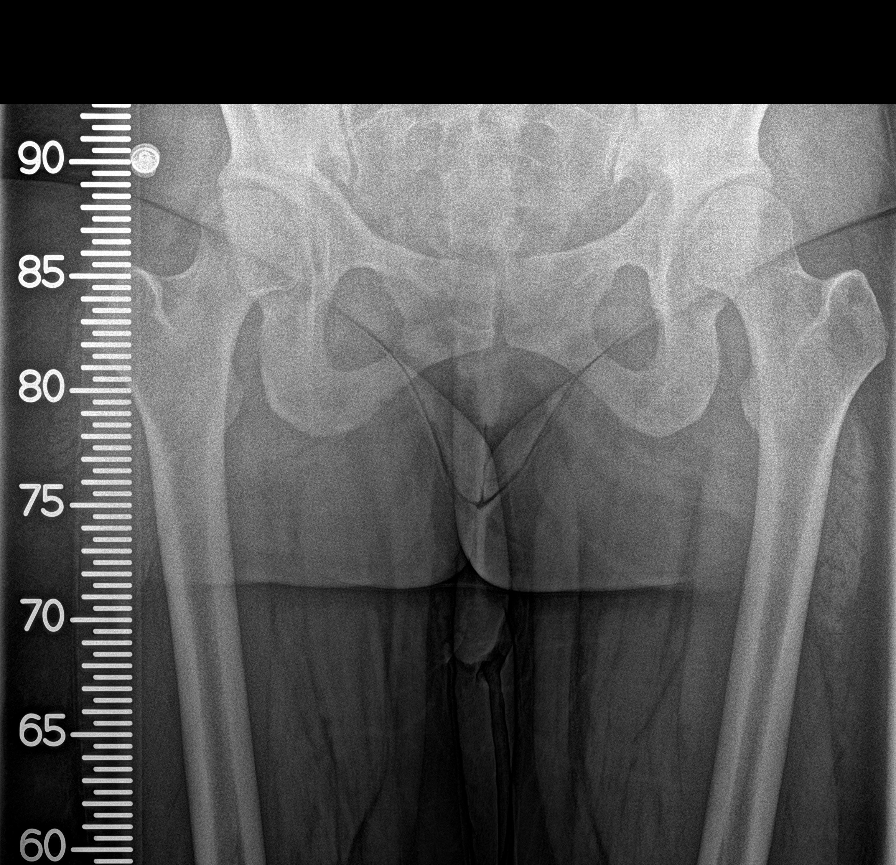
[im 3/4]
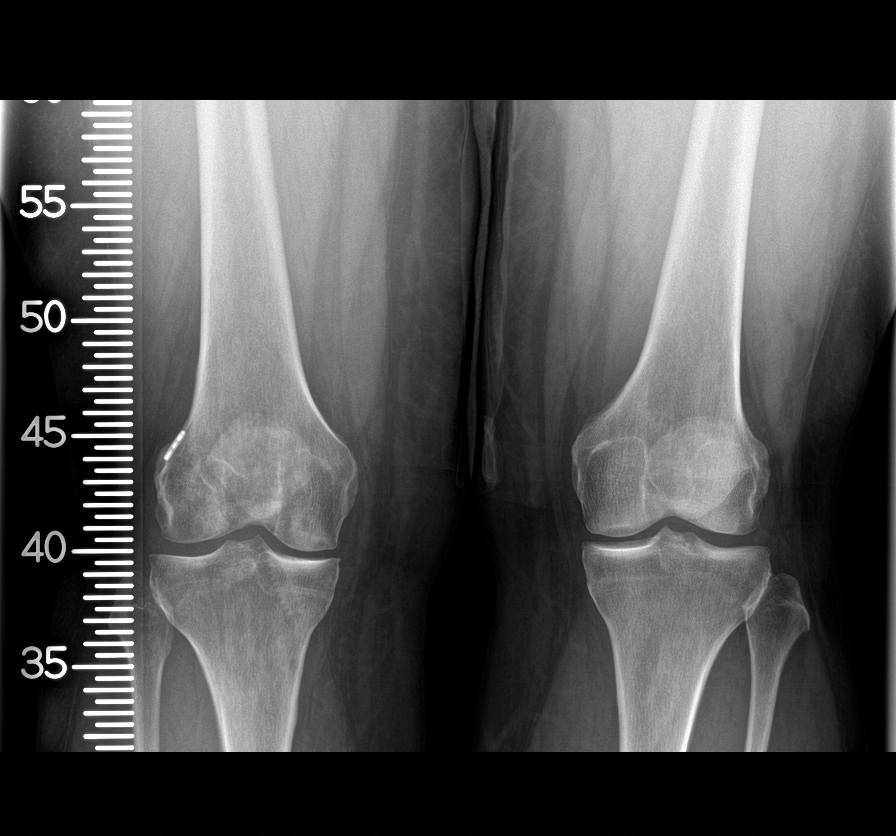
[im 4/4]
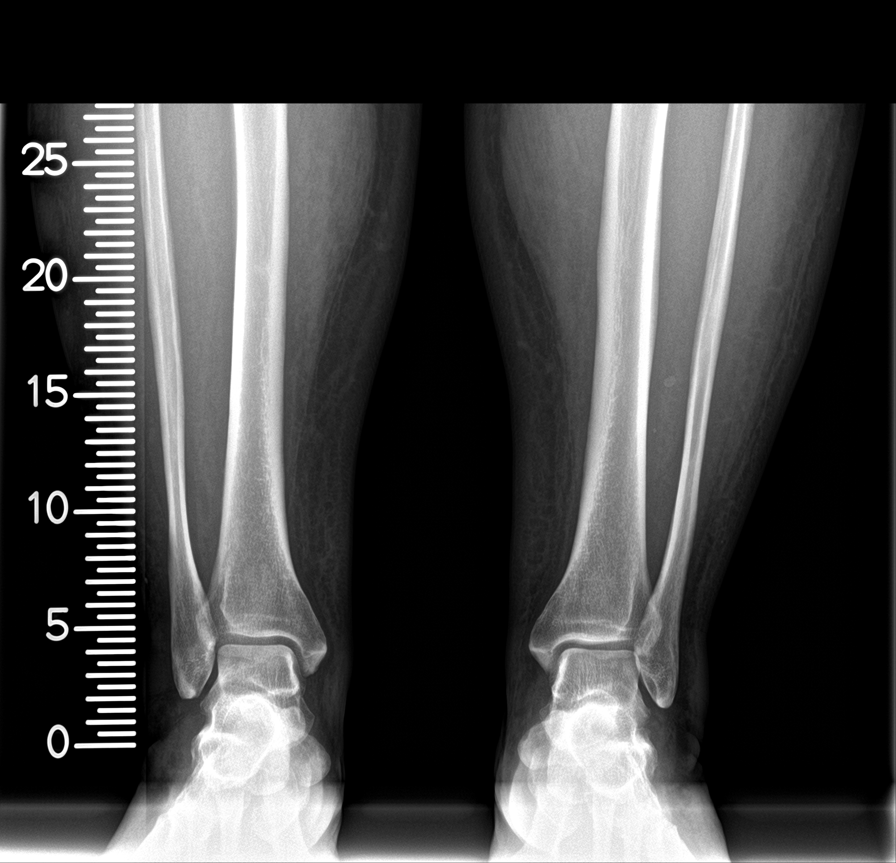

[4 of 4 positions shown; findings below may reference images not displayed]

FINDINGS: No acute or focal bony or joint abnormality is identified. As
measured from the tops of the femoral heads to the central tibial
plafonds, the right leg measures 92.4 cm and the left leg measures
93 cm. Knee joint spaces are maintained. The patient is status post
ACL grafting on the right. Soft tissues are negative.
IMPRESSION: Near perfectly symmetric right and left leg length. No acute or
focal abnormality is identified.

Status post ACL grafting on the right.

## 2021-03-25 ENCOUNTER — Other Ambulatory Visit: Payer: Self-pay | Admitting: Orthopedic Surgery

## 2021-03-27 ENCOUNTER — Encounter: Payer: Self-pay | Admitting: Orthopedic Surgery

## 2021-03-27 ENCOUNTER — Other Ambulatory Visit: Payer: Self-pay

## 2021-03-28 ENCOUNTER — Encounter: Payer: Self-pay | Admitting: Orthopedic Surgery

## 2021-04-04 ENCOUNTER — Encounter: Payer: Self-pay | Admitting: Orthopedic Surgery

## 2021-04-04 ENCOUNTER — Encounter
Admission: RE | Disposition: A | Discharge status: Home / Self Care | Payer: Self-pay | Source: Home / Self Care | Attending: Orthopedic Surgery

## 2021-04-04 ENCOUNTER — Ambulatory Visit
Admission: RE | Admit: 2021-04-04 | Discharge: 2021-04-04 | Disposition: A | Discharge status: Home / Self Care | Payer: BC Managed Care – PPO | Attending: Orthopedic Surgery | Admitting: Orthopedic Surgery

## 2021-04-04 ENCOUNTER — Ambulatory Visit: Payer: BC Managed Care – PPO | Admitting: Anesthesiology

## 2021-04-04 ENCOUNTER — Other Ambulatory Visit: Payer: Self-pay

## 2021-04-04 DIAGNOSIS — X58XXXA Exposure to other specified factors, initial encounter: Secondary | ICD-10-CM | POA: Diagnosis not present

## 2021-04-04 DIAGNOSIS — M1711 Unilateral primary osteoarthritis, right knee: Secondary | ICD-10-CM | POA: Insufficient documentation

## 2021-04-04 DIAGNOSIS — S83241A Other tear of medial meniscus, current injury, right knee, initial encounter: Secondary | ICD-10-CM | POA: Diagnosis not present

## 2021-04-04 DIAGNOSIS — Z86711 Personal history of pulmonary embolism: Secondary | ICD-10-CM | POA: Insufficient documentation

## 2021-04-04 DIAGNOSIS — K219 Gastro-esophageal reflux disease without esophagitis: Secondary | ICD-10-CM | POA: Insufficient documentation

## 2021-04-04 DIAGNOSIS — J45909 Unspecified asthma, uncomplicated: Secondary | ICD-10-CM | POA: Diagnosis not present

## 2021-04-04 DIAGNOSIS — Z7901 Long term (current) use of anticoagulants: Secondary | ICD-10-CM | POA: Diagnosis not present

## 2021-04-04 HISTORY — DX: Other seasonal allergic rhinitis: J30.2

## 2021-04-04 HISTORY — DX: Presence of dental prosthetic device (complete) (partial): Z97.2

## 2021-04-04 HISTORY — DX: Personal history of other venous thrombosis and embolism: Z86.718

## 2021-04-04 HISTORY — PX: KNEE ARTHROSCOPY WITH MEDIAL MENISECTOMY: SHX5651

## 2021-04-04 LAB — POCT PREGNANCY, URINE: Preg Test, Ur: NEGATIVE

## 2021-04-04 SURGERY — ARTHROSCOPY, KNEE, WITH MEDIAL MENISCECTOMY
Anesthesia: General | Site: Knee | Laterality: Right

## 2021-04-04 MED ORDER — ACETAMINOPHEN 500 MG PO TABS: 1000.0000 mg | ORAL_TABLET | Freq: Three times a day (TID) | ORAL | 2 refills | Status: AC

## 2021-04-04 MED ORDER — PROCHLORPERAZINE EDISYLATE 10 MG/2ML IJ SOLN: 5.0000 mg | INTRAMUSCULAR | Status: DC | PRN

## 2021-04-04 MED ORDER — GABAPENTIN 300 MG PO CAPS: 300.0000 mg | ORAL_CAPSULE | Freq: Three times a day (TID) | ORAL | 1 refills | Status: AC

## 2021-04-04 MED ORDER — ACETAMINOPHEN 10 MG/ML IV SOLN
1000.0000 mg | Freq: Once | INTRAVENOUS | Status: AC
Start: 2021-04-04 — End: 2021-04-04
  Administered 2021-04-04: 1000 mg via INTRAVENOUS

## 2021-04-04 MED ORDER — CEFAZOLIN SODIUM-DEXTROSE 2-4 GM/100ML-% IV SOLN
2.0000 g | INTRAVENOUS | Status: AC
  Administered 2021-04-04: 2 g via INTRAVENOUS

## 2021-04-04 MED ORDER — PROPOFOL 10 MG/ML IV BOLUS
INTRAVENOUS | Status: DC | PRN
  Administered 2021-04-04: 200 mg via INTRAVENOUS

## 2021-04-04 MED ORDER — OXYCODONE HCL 5 MG PO TABS: 5.0000 mg | ORAL_TABLET | Freq: Once | ORAL | Status: AC | PRN

## 2021-04-04 MED ORDER — LACTATED RINGERS IR SOLN
Status: DC | PRN
Start: 2021-04-04 — End: 2021-04-04
  Administered 2021-04-04: 6000 mL
  Administered 2021-04-04: 3000 mL
  Administered 2021-04-04: 6000 mL
  Administered 2021-04-04: 12000 mL

## 2021-04-04 MED ORDER — ONDANSETRON HCL 4 MG/2ML IJ SOLN
INTRAMUSCULAR | Status: DC | PRN
  Administered 2021-04-04: 4 mg via INTRAVENOUS

## 2021-04-04 MED ORDER — FENTANYL CITRATE (PF) 100 MCG/2ML IJ SOLN
INTRAMUSCULAR | Status: DC | PRN
  Administered 2021-04-04: 100 ug via INTRAVENOUS

## 2021-04-04 MED ORDER — KETAMINE HCL 50 MG/ML IJ SOLN
INTRAMUSCULAR | Status: DC | PRN
  Administered 2021-04-04: 30 mg via INTRAMUSCULAR

## 2021-04-04 MED ORDER — HYDROMORPHONE HCL 1 MG/ML IJ SOLN
0.2500 mg | INTRAMUSCULAR | Status: DC | PRN
  Administered 2021-04-04: 0.5 mg via INTRAVENOUS
  Administered 2021-04-04: 0.25 mg via INTRAVENOUS
  Administered 2021-04-04: 0.5 mg via INTRAVENOUS
  Administered 2021-04-04: 0.25 mg via INTRAVENOUS

## 2021-04-04 MED ORDER — OXYCODONE HCL 5 MG/5ML PO SOLN
5.0000 mg | Freq: Once | ORAL | Status: AC | PRN
  Administered 2021-04-04: 5 mg via ORAL

## 2021-04-04 MED ORDER — ROPIVACAINE HCL 5 MG/ML IJ SOLN
INTRAMUSCULAR | Status: DC | PRN
  Administered 2021-04-04: 50 mg via EPIDURAL
  Administered 2021-04-04: 25 mg via EPIDURAL
  Administered 2021-04-04: 50 mg via EPIDURAL

## 2021-04-04 MED ORDER — LACTATED RINGERS IV SOLN: INTRAVENOUS | Status: DC

## 2021-04-04 MED ORDER — DEXMEDETOMIDINE (PRECEDEX) IN NS 20 MCG/5ML (4 MCG/ML) IV SYRINGE
PREFILLED_SYRINGE | INTRAVENOUS | Status: DC | PRN
  Administered 2021-04-04: 10 ug via INTRAVENOUS

## 2021-04-04 MED ORDER — DEXAMETHASONE SODIUM PHOSPHATE 4 MG/ML IJ SOLN
INTRAMUSCULAR | Status: DC | PRN
  Administered 2021-04-04: 4 mg via INTRAVENOUS

## 2021-04-04 MED ORDER — LIDOCAINE-EPINEPHRINE 1 %-1:100000 IJ SOLN
INTRAMUSCULAR | Status: DC | PRN
  Administered 2021-04-04: 2 mL

## 2021-04-04 MED ORDER — ONDANSETRON 4 MG PO TBDP: 4.0000 mg | ORAL_TABLET | Freq: Three times a day (TID) | ORAL | 0 refills | Status: AC | PRN

## 2021-04-04 MED ORDER — OXYCODONE HCL 5 MG PO TABS: 5.0000 mg | ORAL_TABLET | ORAL | 0 refills | Status: AC | PRN

## 2021-04-04 MED ORDER — GABAPENTIN 300 MG PO CAPS: 300.0000 mg | ORAL_CAPSULE | Freq: Three times a day (TID) | ORAL | 0 refills | Status: AC

## 2021-04-04 MED ORDER — MIDAZOLAM HCL 2 MG/2ML IJ SOLN
INTRAMUSCULAR | Status: DC | PRN
Start: 2021-04-04 — End: 2021-04-04
  Administered 2021-04-04 (×2): 2 mg via INTRAVENOUS

## 2021-04-04 MED ORDER — SCOPOLAMINE 1 MG/3DAYS TD PT72SCOPOLAMINE 1 MG/3DAYS
1.0000 | MEDICATED_PATCH | Freq: Once | TRANSDERMAL | Status: DC
Start: 2021-04-04 — End: 2021-04-04
  Administered 2021-04-04: 1.5 mg via TRANSDERMAL

## 2021-04-04 MED ORDER — LIDOCAINE HCL (CARDIAC) PF 100 MG/5ML IV SOSY
PREFILLED_SYRINGE | INTRAVENOUS | Status: DC | PRN
  Administered 2021-04-04: 50 mg via INTRATRACHEAL

## 2021-04-04 SURGICAL SUPPLY — 42 items
ADAPTER IRRIG TUBE 2 SPIKE SOL (ADAPTER) ×5 IMPLANT
ADPR TBG 2 SPK PMP STRL ASCP (ADAPTER) ×2
APL PRP STRL LF DISP 70% ISPRP (MISCELLANEOUS) ×1
BLADE FULL RADIUS 3.5 (BLADE) ×3 IMPLANT
BLADE SURG SZ11 CARB STEEL (BLADE) ×3 IMPLANT
BNDG COHESIVE 4X5 TAN ST LF (GAUZE/BANDAGES/DRESSINGS) ×3 IMPLANT
BNDG ESMARK 6X12 TAN STRL LF (GAUZE/BANDAGES/DRESSINGS) ×3 IMPLANT
CARTRIDGE SUT 2-0 NONSTITCH (Anchor) ×6 IMPLANT
CHLORAPREP W/TINT 26 (MISCELLANEOUS) ×3 IMPLANT
COOLER POLAR GLACIER W/PUMP (MISCELLANEOUS) ×3 IMPLANT
COVER LIGHT HANDLE UNIVERSAL (MISCELLANEOUS) ×6 IMPLANT
CUFF TOURN SGL QUICK 30 (TOURNIQUET CUFF) ×3
CUFF TRNQT CYL 30X4X21-28X (TOURNIQUET CUFF) ×1 IMPLANT
DEVICE SUCT BLK HOLE OR FLOOR (MISCELLANEOUS) ×2 IMPLANT
DRAPE EXTREMITY T 121X128X90 (DISPOSABLE) ×3 IMPLANT
DRAPE IMP U-DRAPE 54X76 (DRAPES) ×3 IMPLANT
GAUZE SPONGE 4X4 12PLY STRL (GAUZE/BANDAGES/DRESSINGS) ×3 IMPLANT
GLOVE SRG 8 PF TXTR STRL LF DI (GLOVE) ×1 IMPLANT
GLOVE SURG ENC MOIS LTX SZ7.5 (GLOVE) ×3 IMPLANT
GLOVE SURG UNDER POLY LF SZ8 (GLOVE) ×3
GOWN STRL REUS W/ TWL LRG LVL3 (GOWN DISPOSABLE) ×1 IMPLANT
GOWN STRL REUS W/TWL LRG LVL3 (GOWN DISPOSABLE) ×3
IV LACTATED RINGER IRRG 3000ML (IV SOLUTION) ×27
IV LR IRRIG 3000ML ARTHROMATIC (IV SOLUTION) ×2 IMPLANT
KIT TURNOVER KIT A (KITS) ×3 IMPLANT
MANAGER SUT NOVOCUT (CUTTER) ×2 IMPLANT
MANIFOLD NEPTUNE II (INSTRUMENTS) ×3 IMPLANT
MAT ABSORB  FLUID 56X50 GRAY (MISCELLANEOUS) ×6
MAT ABSORB FLUID 56X50 GRAY (MISCELLANEOUS) ×1 IMPLANT
NOVOSTICH PRO MENISCAL 2-0 (Miscellaneous) ×9 IMPLANT
PACK ARTHROSCOPY KNEE (MISCELLANEOUS) ×3 IMPLANT
PAD ABD DERMACEA PRESS 5X9 (GAUZE/BANDAGES/DRESSINGS) ×3 IMPLANT
PAD WRAPON POLAR KNEE (MISCELLANEOUS) ×1 IMPLANT
SPONGE T-LAP 18X18 ~~LOC~~+RFID (SPONGE) ×3 IMPLANT
SUT ETHILON 3-0 FS-10 30 BLK (SUTURE) ×3
SUTURE EHLN 3-0 FS-10 30 BLK (SUTURE) ×1 IMPLANT
SYSTEM NVSTCH PRO MENISCAL 2-0 (Miscellaneous) IMPLANT
TOWEL OR 17X26 4PK STRL BLUE (TOWEL DISPOSABLE) ×6 IMPLANT
TUBING INFLOW SET DBFLO PUMP (TUBING) ×3 IMPLANT
TUBING OUTFLOW SET DBLFO PUMP (TUBING) ×3 IMPLANT
WAND WEREWOLF FLOW 90D (MISCELLANEOUS) ×3 IMPLANT
WRAPON POLAR PAD KNEE (MISCELLANEOUS) ×3

## 2021-04-04 NOTE — Op Note (Addendum)
Operative Note  ?  ?SURGERY DATE:04/04/2021  ?  ?PRE-OP DIAGNOSIS:  ?1. Right medial meniscus tear ?2. Right medial compartment degenerative changes ? ?POST-OP DIAGNOSIS:  ?1. Right medial meniscus tear ?2. Right medial compartment degenerative changes ?3. Right PCL cyclops like lesion ?  ?PROCEDURES:  ?1. Right knee arthroscopy, medial meniscus repair ?2. Right knee chondroplasty of medial compartment ?3. Right knee partial synovectomy ?  ?SURGEON: Rosealee Albee, MD ? ?ASSISTANT: Sonny Dandy, PA ?  ?ANESTHESIA: Gen + regional ?  ?ESTIMATED BLOOD LOSS: minimal ?  ?TOTAL IV FLUIDS: per anesthesia ?  ?INDICATION(S): The patient is a 36 y.o. female who initially underwent right ACL reconstruction, medial meniscus repair of bucket-handle tear, lateral meniscus repair, and chondroplasty on 11/04/2020 by me.  The patient had persistent sensations of medial knee pain postoperatively.  Clinical exam and repeat MRI was suggestive of complex medial meniscus re-tear. After discussion of risks, benefits, and alternatives to surgery, the patient elected to proceed.  The patient understands that there is a higher risk of re-tear with meniscus repair, but would prefer repair to maintain the normal biomechanics and structure of the knee. The patient is willing to perform the appropriate rehab and maintain weight-bearing restrictions post-operatively. ?  ?OPERATIVE FINDINGS:  ?  ?Examination under anesthesia: A careful examination under anesthesia was performed.  Passive range of motion was: Hyperextension: 2.  Extension: 0.  Flexion: 125.  Lachman: normal. Pivot Shift: normal.  Posterior drawer: normal.  Varus stability in full extension: normal.  Varus stability in 30 degrees of flexion: normal.  Valgus stability in full extension: normal.  Valgus stability in 30 degrees of flexion: normal. ?  ?Intra-operative findings: A thorough arthroscopic examination of the knee was performed.  The findings are: ?1. Suprapatellar pouch:  Normal ?2. Undersurface of median ridge: Grade 1 softening ?3. Medial patellar facet: Grade grade 3 degenerative changes ?4. Lateral patellar facet: Grade 1 softening ?5. Trochlea: Grade 1 changes ?6. Lateral gutter/popliteus tendon: Normal ?7. Hoffa's fat pad: Normal ?8. Medial gutter/plica: Normal ?9. ACL: Graft intact ?10. PCL: Normal, cyclops type lesion appearing anterior to the PCL ?11. Medial meniscus: Complex tear involving a circumferential vertical tear from the posterior horn to the body with the degenerative edges affecting approximately 60% of the meniscus width.  There was an additional horizontal tear of the posterior horn affecting the remainder of the meniscus extending the full depth to the meniscocapsular junction.  This also spanned the entire width of the posterior horn and extended into the body.   ?12. Medial compartment cartilage: Large area of grade 3 degenerative changes; grade 1 degenerative change to the tibial plateau  ?13. Lateral meniscus: Healed vertical tear of the posterior horn adjacent to the meniscus root; meniscus intact  ?14. Lateral compartment cartilage: Grade 1degenerative changes to the tibial plateau, normal lateral femoral condyle ?  ?OPERATIVE REPORT:   ?  ?I identified the patient in the pre-operative holding area.  I marked the operative knee with my initials. I reviewed the risks and benefits of the proposed surgical intervention, and the patient (and/or patient's guardian) wished to proceed. The patient was transferred to the operative suite and placed in the supine position with all bony prominences padded.  Anesthesia was administered. Appropriate IV antibiotics were administered within 30 minutes of incision. The extremity was then prepped and draped in standard fashion. A time out was performed confirming the correct extremity, correct patient, and correct procedure. ?  ?Arthroscopy portals were marked. Local anesthetic  was injected to the planned portal sites.  The anterolateral portal was established with an 11 blade. The arthroscope was placed in the anterolateral portal and then into the suprapatellar pouch.  Next the medial portal was established under needle localization. A spinal needle was used to pie-crust the MCL to allow for better visualization and protect the cartilage surfaces during instrumentation. The complex medial meniscus tear was identified. A diagnostic knee scope was completed with the above findings.  ?  ?First, a partial synovectomy about the ACL was performed using an oscillating shaver.  This allowed for removal of scar tissue as well as the PCL cyclops appearing lesion. Then, chondroplasty of the medial femoral condyle was performed such that there were stable cartilaginous edges. This was performed with an oscillating shaver.  Next, attention was turned to the medial meniscus.  The vertical tear involved the white-white/red-white zones of the meniscus.  The edges were degenerative.  Therefore, the meniscus tissue anterior to the vertical split was debrided using combination of arthroscopic biters and an oscillating shaver.  Next, the horizontal tear affecting the remainder of the meniscus was identified.  The edges of the meniscus tear and capsule were roughened with a rasp and shaver to create a more optimal healing surface.  Ceterix Novostitch x6 all-inside sutures were passed in a hay bale fashion to reduce the horizontal meniscus tear.  Afterwards, the meniscus was probed and felt to be stable. Microfracture of the intercondylar notch was then performed to allow for improved meniscus healing. Arthroscopic fluid was removed from the joint. ?  ?The portals were closed with 3-0 Nylon suture. Sterile dressings included Xeroform, 4x4s, Sof-Rol, and Bias wrap. A Polarcare was placed. A T-scope hinged knee brace was applied.  The patient was then awakened and taken to the PACU hemodynamically stable without complication. ?  ?Of note, assistance  from a PA was essential to performing the surgery.  PA was present for the entire surgery.  PA assisted with patient positioning both preoperatively and intraoperatively, instrumentation, and wound closure. The surgery would have been more difficult and had longer operative time without PA assistance.  ?  ?Additionally, this case had increased complexity compared to standard meniscus repair given the complexity of this patient's tear as well as this being a revision repair.  Repair of this tear involved both debriding a significant portion of the meniscus and then performing a repair given the complex nature of the tear as outlined above.  Additionally, repair of this tear necessitated the use of 6 stitches, adding to the surgical time.  These additional steps added approximately 30 minutes compared to standard meniscus repair. ? ? ?POSTOPERATIVE PLAN: ?The patient will be discharged home today once they meet PACU criteria. Aspirin 325 mg daily was prescribed for 4 weeks for DVT prophylaxis.  Physical therapy will start on POD#3-4. FFWB x 4 weeks. F/U in 2 weeks.  ? ?

## 2021-04-04 NOTE — H&P (Signed)
Paper H&P to be scanned into permanent record. H&P reviewed. No significant changes noted.  

## 2021-04-04 NOTE — Discharge Instructions (Signed)
Arthroscopic Knee Surgery - Meniscus Repair ?  ?Post-Op Instructions ?  ?1. Bracing or crutches: Crutches will be provided at the time of discharge from the surgery center. Keep brace locked in extension at all times except as directed by physical therapy.  ?  ?2. Ice: You may be provided with a device [facility]) that allows you to ice the affected area effectively. Otherwise you can ice manually.  ?  ?3. Driving:  Plan on not driving for at least four weeks. Please note that you are advised NOT to drive while taking narcotic pain medications as you may be impaired and unsafe to drive. ?  ?4. Activity: Ankle pumps several times an hour while awake to prevent blood clots. Weight bearing: FLAT FOOT BEARING FOR 4 WEEKS. Use crutches for at least 4 weeks, if not 6 based on your surgery. Bending and straightening the knee is unlimited, but do not flex your knee past 90 degrees until cleared by your therapist. Elevate knee above heart level as much as possible for one week. Avoid standing more than 5 minutes (consecutively) for the first week. No exercise involving the knee until cleared by the surgeon or physical therapist.  Avoid long distance travel for 4 weeks.  ? ?5. Medications:  ?- You have been provided a prescription for narcotic pain medicine. After surgery, take 1-2 narcotic tablets every 4 hours if needed for severe pain. If it has tylenol (acetaminophen), please do not take a total of more than 3000mg /day of tylenol.  ?- A prescription for anti-nausea medication will be provided in case the narcotic medicine causes nausea - take 1 tablet every 6 hours only if nauseated.  ?- Restart home anticoagulation (Xarelto or Eliquis) the day after surgery at normal dose.  ?-Take tylenol 1000 every 8 hours for pain.  May stop tylenol 3 days after surgery or when you are having minimal pain. If your narcotic has tylenol (acetaminophen), please do not take a total of more than 3000mg /day of tylenol.  ?-You may take  gabapentin 300 mg 3 times daily if needed x 5-10 days. ? ?  ?If you are taking prescription medication for anxiety, depression, insomnia, muscle spasm, chronic pain, or for attention deficit disorder you are advised that you are at a higher risk of adverse effects with use of narcotics post-op, including narcotic addiction/dependence, depressed breathing, death. ?If you use non-prescribed substances: alcohol, marijuana, cocaine, heroin, methamphetamines, etc., you are at a higher risk of adverse effects with use of narcotics post-op, including narcotic addiction/dependence, depressed breathing, death. ?You are advised that taking > 50 morphine milligram equivalents (MME) of narcotic pain medication per day results in twice the risk of overdose or death. For your prescription provided: oxycodone 5 mg - taking more than 6 tablets per day. Be advised that we will prescribe narcotics short-term, for acute post-operative pain only - 1 week for minor operations such as knee arthroscopy for meniscus tear resection, and 3 weeks for major operations such as knee repair/reconstruction surgeries.  ? ?6. Bandages: The physical therapist should change the bandages at the first post-op appointment. If needed, the dressing supplies have been provided to you. You may shower after this with waterproof bandaids covering the incisions.  ?  ?7. Physical Therapy: 2 times per week for the first 4 weeks, then 1-2 times per week from weeks 4-8 post-op. Therapy typically starts on post operative Day 3 or 4. You have been provided an order for physical therapy. The therapist will provide home  exercises. ?  ?8. Work: May return to full work when off of crutches. May do light duty/desk job in approximately 1-2 weeks when off of narcotics, pain is well-controlled, and swelling has decreased. ?  ?9. Post-Op Appointments: ?Your first post-op appointment will be with Dr. Allena Katz in approximately 2 weeks time.  ?  ?If you find that they have not been  scheduled please call the Orthopaedic Appointment front desk at [phone]. ? ? ?

## 2021-04-04 NOTE — Anesthesia Preprocedure Evaluation (Signed)
Anesthesia Evaluation  ?Patient identified by MRN, date of birth, ID band ?Patient awake ? ? ? ?Reviewed: ?Allergy & Precautions, H&P , NPO status , Patient's Chart, lab work & pertinent test results, reviewed documented beta blocker date and time  ? ?Airway ?Mallampati: II ? ?TM Distance: >3 FB ?Neck ROM: full ? ? ? Dental ?no notable dental hx. ? ?  ?Pulmonary ?neg pulmonary ROS, asthma , PE (eliquis (11/2020) specialist reported okay to stop for 3 days) ?  ?Pulmonary exam normal ?breath sounds clear to auscultation ? ? ? ? ? ? Cardiovascular ?Exercise Tolerance: Good ?negative cardio ROS ? ? ?Rhythm:regular Rate:Normal ? ? ?  ?Neuro/Psych ?negative neurological ROS ? negative psych ROS  ? GI/Hepatic ?negative GI ROS, Neg liver ROS, GERD  ,  ?Endo/Other  ?negative endocrine ROS ? Renal/GU ?negative Renal ROS  ?negative genitourinary ?  ?Musculoskeletal ? ?(+) Arthritis ,  ? Abdominal ?  ?Peds ? Hematology ?negative hematology ROS ?(+)   ?Anesthesia Other Findings ? ? Reproductive/Obstetrics ?negative OB ROS ? ?  ? ? ? ? ? ? ? ? ? ? ? ? ? ?  ?  ? ? ? ? ? ? ? ?Anesthesia Physical ?Anesthesia Plan ? ?ASA: 3 ? ?Anesthesia Plan: General  ? ?Post-op Pain Management: Regional block  ? ?Induction:  ? ?PONV Risk Score and Plan: 2 and Dexamethasone, Ondansetron and Treatment may vary due to age or medical condition ? ?Airway Management Planned:  ? ?Additional Equipment:  ? ?Intra-op Plan:  ? ?Post-operative Plan:  ? ?Informed Consent: I have reviewed the patients History and Physical, chart, labs and discussed the procedure including the risks, benefits and alternatives for the proposed anesthesia with the patient or authorized representative who has indicated his/her understanding and acceptance.  ? ? ? ?Dental Advisory Given ? ?Plan Discussed with: CRNA ? ?Anesthesia Plan Comments:   ? ? ? ? ? ? ?Anesthesia Quick Evaluation ? ?

## 2021-04-04 NOTE — Anesthesia Postprocedure Evaluation (Signed)
Anesthesia Post Note ? ?Patient: EVONDA ENGE ? ?Procedure(s) Performed: Right knee arthroscopy, revision medial meniscus repair chondroplasty (Right: Knee) ? ? ?  ?Patient location during evaluation: PACU ?Anesthesia Type: General ?Level of consciousness: awake and alert ?Pain management: pain level controlled ?Vital Signs Assessment: post-procedure vital signs reviewed and stable ?Respiratory status: spontaneous breathing, nonlabored ventilation and respiratory function stable ?Cardiovascular status: blood pressure returned to baseline and stable ?Postop Assessment: no apparent nausea or vomiting ?Anesthetic complications: no ? ? ?No notable events documented. ? ?Loni Beckwith ? ? ? ? ? ?

## 2021-04-04 NOTE — Anesthesia Procedure Notes (Signed)
Anesthesia Regional Block: Adductor canal block  ? ?Pre-Anesthetic Checklist: , timeout performed,  Correct Patient, Correct Site, Correct Laterality,  Correct Procedure, Correct Position, site marked,  Risks and benefits discussed,  Surgical consent,  Pre-op evaluation,  At surgeon's request and post-op pain management ? ?Laterality: Right ? ?Prep: chloraprep     ?  ?Needles:  ?Injection technique: Single-shot ? ?Needle Type: Stimiplex   ? ? ? ?Needle Gauge: 20  ? ? ? ?Additional Needles: ? ? ?Procedures:,,,, ultrasound used (permanent image in chart),,    ?Narrative:  ?Start time: 04/04/2021 10:56 AM ?End time: 04/04/2021 11:15 AM ?Injection made incrementally with aspirations every 5 mL. ? ?Performed by: Personally  ?Anesthesiologist: Loni Beckwith, MD ? ?Additional Notes: ?Adductor canal block 59ml 0.5% ropivicaine ?IPACK: 40ml 0.25% ropivicaine ?Superolateral genicular and nerve to vastus intermedius: 53ml 0.25% ?Superomedial genicular begun but procedure stopped 2/2 patient discomfort outweighing benefit. ? ? ? ?

## 2021-04-04 NOTE — Transfer of Care (Signed)
Immediate Anesthesia Transfer of Care Note ? ?Patient: Jaclyn Mitchell ? ?Procedure(s) Performed: Right knee arthroscopy, revision medial meniscus repair chondroplasty (Right: Knee) ? ?Patient Location: PACU ? ?Anesthesia Type: General ? ?Level of Consciousness: awake, alert  and patient cooperative ? ?Airway and Oxygen Therapy: Patient Spontanous Breathing and Patient connected to supplemental oxygen ? ?Post-op Assessment: Post-op Vital signs reviewed, Patient's Cardiovascular Status Stable, Respiratory Function Stable, Patent Airway and No signs of Nausea or vomiting ? ?Post-op Vital Signs: Reviewed and stable ? ?Complications: No notable events documented. ? ?

## 2021-04-04 NOTE — Anesthesia Procedure Notes (Signed)
Procedure Name: LMA Insertion ?Date/Time: 04/04/2021 1:07 PM ?Performed by: Mayme Genta, CRNA ?Pre-anesthesia Checklist: Patient identified, Emergency Drugs available, Suction available, Timeout performed and Patient being monitored ?Patient Re-evaluated:Patient Re-evaluated prior to induction ?Oxygen Delivery Method: Circle system utilized ?Preoxygenation: Pre-oxygenation with 100% oxygen ?Induction Type: IV induction ?LMA: LMA inserted ?LMA Size: 4.0 ?Number of attempts: 1 ?Placement Confirmation: positive ETCO2 and breath sounds checked- equal and bilateral ?Tube secured with: Tape ? ? ? ? ?

## 2021-04-07 ENCOUNTER — Encounter: Payer: Self-pay | Admitting: Orthopedic Surgery

## 2021-05-26 ENCOUNTER — Encounter: Payer: Self-pay | Admitting: Oncology

## 2021-05-26 ENCOUNTER — Inpatient Hospital Stay: Payer: BC Managed Care – PPO

## 2021-05-26 ENCOUNTER — Inpatient Hospital Stay: Payer: BC Managed Care – PPO | Attending: Oncology | Admitting: Oncology

## 2021-05-26 VITALS — BP 147/81 | HR 109 | Temp 97.9°F | Wt 235.0 lb

## 2021-05-26 DIAGNOSIS — Z86711 Personal history of pulmonary embolism: Secondary | ICD-10-CM | POA: Insufficient documentation

## 2021-05-26 DIAGNOSIS — Z79899 Other long term (current) drug therapy: Secondary | ICD-10-CM | POA: Diagnosis not present

## 2021-05-26 NOTE — Progress Notes (Signed)
?Hematology/Oncology Consult note ?Telephone:([phone] Fax:(336) [phone] ?  ? ?   ? ? ?Patient Care Team: ?[facility] as PCP - General ? ?REFERRING PROVIDER: ?Vida RiggerAleskerov, Fuad, MD  ?CHIEF COMPLAINTS/REASON FOR VISIT:  ?Evaluation of pulmonary embolism ? ?HISTORY OF PRESENTING ILLNESS:  ? ?Jaclyn Mitchell is a  36 y.o.  female with PMH listed below was seen in consultation at the request of  Vida RiggerAleskerov, Fuad, MD  for evaluation of history of pulmonary embolism. ? ?11/04/2020, patient underwent right knee anteriorly cruciate ligament tear repair. ?Patient developed left lower chest pain intermittent, exacerbated with deep breaths.  She was seen in the Coumadin clinic and a D-dimer was positive. ?11/29/2020, CT angiogram PE protocol showed positive nonocclusive pulmonary embolism involving the left lower lobe pulmonary artery.  Overall clot burden was very small in volume.  No CT evidence of right heart strain or pulmonary infarct.  Repeat CT angiogram of the chest on 12/01/2020 showed similar findings. ?Patient took Eliquis for anticoagulation. ? ?04/04/2021, patient underwent right to arthroscopy for medial meniscus repair and chondroplasty of medial compartment.,  Partial synovectomy.  She held Eliquis for 1 week prior to the surgery and resume the day after the procedure.  ?She continued on Eliquis 5 mg twice daily until approximately 2 to 3 weeks ago-made April 2023, she ran out of medication and stopped Eliquis. ? ?Today patient denies any shortness of breath, lower extremity swelling other than postsurgical changes of the right lower leg.  Denies any family history of blood clots.  Pulmonary embolism diagnosed in October 2022 and was her first and only thrombosis event. ? ?Patient was recently seen by Dr. Karna ChristmasAleskerov and was recommended to stop anticoagulation.  She was sent to hematology for evaluation.  Patient said that she has an appointment  with physical therapist after today's  appointment ? ? ?Review of Systems  ?Constitutional:  Negative for appetite change, chills, fatigue and fever.  ?HENT:   Negative for hearing loss and voice change.   ?Eyes:  Negative for eye problems.  ?Respiratory:  Negative for chest tightness and cough.   ?Cardiovascular:  Negative for chest pain.  ?Gastrointestinal:  Negative for abdominal distention, abdominal pain and blood in stool.  ?Endocrine: Negative for hot flashes.  ?Genitourinary:  Negative for difficulty urinating and frequency.   ?Musculoskeletal:  Positive for arthralgias.  ?     Right knee status repair.   ?Skin:  Negative for itching and rash.  ?Neurological:  Negative for extremity weakness.  ?Hematological:  Negative for adenopathy.  ?Psychiatric/Behavioral:  Negative for confusion.   ? ?MEDICAL HISTORY:  ?Past Medical History:  ?Diagnosis Date  ? Arthritis   ? right knee  ? Aspiration pneumonia (HCC)   ? Asthma   ? flared up 1 year ago  ? GERD (gastroesophageal reflux disease)   ? Pulmonary embolism (HCC) 11/29/2020  ? One month after knee surgery - plan to take Eliquis for 6 months  ? Seasonal allergies   ? headaches  ? Wears partial dentures   ? upper  ? ? ?SURGICAL HISTORY: ?Past Surgical History:  ?Procedure Laterality Date  ? ANTERIOR CRUCIATE LIGAMENT REPAIR Right 11/04/2020  ? Procedure: Right arthroscopic ACL reconstruction using quadriceps tendon autograft, medial meniscus repair of bucket handle tear, possible chondroplasty;  Surgeon: Signa KellPatel, Sunny, MD;  Location: [facility];  Service: Orthopedics;  Laterality: Right;  ? KNEE ARTHROSCOPY WITH MEDIAL MENISECTOMY Right 04/04/2021  ? Procedure: Right knee arthroscopy, revision medial meniscus repair chondroplasty;  Surgeon:  Signa Kell, MD;  Location: [facility];  Service: Orthopedics;  Laterality: Right;  ? ? ?SOCIAL HISTORY: ?Social History  ? ?Socioeconomic History  ? Marital status: Single  ?  Spouse name: Not on file  ? Number of children: Not on file  ? Years of education:  Not on file  ? Highest education level: Not on file  ?Occupational History  ? Occupation: works at [employer], has done outdoor camps, afterschool care  ?Tobacco Use  ? Smoking status: Never  ? Smokeless tobacco: Never  ?Substance and Sexual Activity  ? Alcohol use: No  ? Drug use: No  ? Sexual activity: Not Currently  ?Other Topics Concern  ? Not on file  ?Social History Narrative  ? Lives with her mom  ? ?Social Determinants of Health  ? ?Financial Resource Strain: Not on file  ?Food Insecurity: Not on file  ?Transportation Needs: Not on file  ?Physical Activity: Not on file  ?Stress: Not on file  ?Social Connections: Not on file  ?Intimate Partner Violence: Not on file  ? ? ?FAMILY HISTORY: ?Family History  ?Problem Relation Age of Onset  ? Heart attack Maternal Grandmother   ? Asthma Father   ? Asthma Paternal Uncle   ? Thyroid disease Maternal Grandmother   ? ? ?ALLERGIES:  is allergic to peanut oil, ceftriaxone sodium, fire ant, penicillins, symbicort [budesonide-formoterol fumarate], and ibuprofen. ? ?MEDICATIONS:  ?Current Outpatient Medications  ?Medication Sig Dispense Refill  ? acetaminophen (TYLENOL) 500 MG tablet Take 2 tablets (1,000 mg total) by mouth every 8 (eight) hours. 90 tablet 2  ? albuterol (PROVENTIL HFA;VENTOLIN HFA) 108 (90 BASE) MCG/ACT inhaler Inhale 2 puffs into the lungs every 6 (six) hours as needed for wheezing or shortness of breath. For breathing    ? albuterol (PROVENTIL) (2.5 MG/3ML) 0.083% nebulizer solution USE 1 VIAL BY NEBULIZATION EVERY 4 (FOUR) HOURS AS NEEDED FOR WHEEZING. 75 mL 0  ? BREO ELLIPTA 200-25 MCG/INH AEPB Inhale 1 puff into the lungs daily.    ? desloratadine (CLARINEX) 5 MG tablet Take 10 mg by mouth daily. mornind    ? diphenhydrAMINE (BENADRYL) 25 mg capsule Take 25 mg by mouth daily as needed for allergies.    ? EPINEPHrine 0.3 mg/0.3 mL IJ SOAJ injection Inject 0.3 mg into the muscle as needed for anaphylaxis.    ? ondansetron (ZOFRAN-ODT) 4 MG disintegrating  tablet Take 1 tablet (4 mg total) by mouth every 8 (eight) hours as needed for nausea or vomiting. 20 tablet 0  ? oxyCODONE (ROXICODONE) 5 MG immediate release tablet Take 1-2 tablets (5-10 mg total) by mouth every 4 (four) hours as needed (pain). 30 tablet 0  ? apixaban (ELIQUIS) 5 MG TABS tablet Take 2 tablets (10 mg total) by mouth 2 (two) times daily for 7 days, THEN 1 tablet (5 mg total) 2 (two) times daily for 21 days. 70 tablet 0  ? gabapentin (NEURONTIN) 300 MG capsule Take 1 capsule (300 mg total) by mouth 3 (three) times daily for 5 days. 30 capsule 1  ? gabapentin (NEURONTIN) 300 MG capsule Take 1 capsule (300 mg total) by mouth 3 (three) times daily for 5 days. 15 capsule 0  ? ?No current facility-administered medications for this visit.  ? ? ? ?PHYSICAL EXAMINATION: ? ?Vitals:  ? 05/26/21 1522  ?BP: (!) 147/81  ?Pulse: (!) 109  ?Temp: 97.9 ?F (36.6 ?C)  ?SpO2: 96%  ? ?Filed Weights  ? 05/26/21 1522  ?Weight: 235 lb (106.6 kg)  ? ? ?  Physical Exam ?Constitutional:   ?   General: She is not in acute distress. ?   Appearance: She is obese.  ?HENT:  ?   Head: Normocephalic and atraumatic.  ?Eyes:  ?   General: No scleral icterus. ?Cardiovascular:  ?   Rate and Rhythm: Normal rate and regular rhythm.  ?   Heart sounds: Normal heart sounds.  ?Pulmonary:  ?   Effort: Pulmonary effort is normal. No respiratory distress.  ?   Breath sounds: No wheezing.  ?Abdominal:  ?   General: Bowel sounds are normal. There is no distension.  ?   Palpations: Abdomen is soft.  ?Musculoskeletal:     ?   General: Normal range of motion.  ?   Cervical back: Normal range of motion and neck supple.  ?   Comments: S/p right knee surgery  ?Skin: ?   General: Skin is warm and dry.  ?   Findings: No erythema or rash.  ?Neurological:  ?   Mental Status: She is alert and oriented to person, place, and time. Mental status is at baseline.  ?   Cranial Nerves: No cranial nerve deficit.  ?   Coordination: Coordination normal.  ?Psychiatric:      ?   Mood and Affect: Mood normal.  ? ? ?LABORATORY DATA:  ?I have reviewed the data as listed ?Lab Results  ?Component Value Date  ? WBC 9.7 12/01/2020  ? HGB 12.0 12/01/2020  ? HCT 36.4 12/01/2020  ? MCV 89.7

## 2021-11-20 ENCOUNTER — Other Ambulatory Visit: Payer: Self-pay | Admitting: Orthopedic Surgery

## 2021-11-20 DIAGNOSIS — S83241S Other tear of medial meniscus, current injury, right knee, sequela: Secondary | ICD-10-CM

## 2021-12-03 ENCOUNTER — Ambulatory Visit
Admission: RE | Admit: 2021-12-03 | Discharge: 2021-12-03 | Disposition: A | Discharge status: Home / Self Care | Payer: BC Managed Care – PPO | Source: Ambulatory Visit | Attending: Orthopedic Surgery | Admitting: Orthopedic Surgery

## 2021-12-03 DIAGNOSIS — M25561 Pain in right knee: Secondary | ICD-10-CM | POA: Diagnosis not present

## 2021-12-03 DIAGNOSIS — S83241S Other tear of medial meniscus, current injury, right knee, sequela: Secondary | ICD-10-CM | POA: Diagnosis present

## 2022-04-22 ENCOUNTER — Other Ambulatory Visit: Payer: Self-pay | Admitting: Orthopedic Surgery

## 2022-04-22 DIAGNOSIS — M1711 Unilateral primary osteoarthritis, right knee: Secondary | ICD-10-CM

## 2022-04-30 ENCOUNTER — Encounter: Payer: Self-pay | Admitting: Orthopedic Surgery

## 2022-05-08 ENCOUNTER — Other Ambulatory Visit: Payer: BC Managed Care – PPO

## 2022-10-22 ENCOUNTER — Emergency Department
Admission: EM | Admit: 2022-10-22 | Discharge: 2022-10-22 | Disposition: A | Discharge status: Home / Self Care | Payer: BC Managed Care – PPO | Attending: Emergency Medicine | Admitting: Emergency Medicine

## 2022-10-22 ENCOUNTER — Encounter: Payer: Self-pay | Admitting: *Deleted

## 2022-10-22 ENCOUNTER — Other Ambulatory Visit: Payer: Self-pay

## 2022-10-22 ENCOUNTER — Emergency Department: Payer: BC Managed Care – PPO

## 2022-10-22 DIAGNOSIS — R1031 Right lower quadrant pain: Secondary | ICD-10-CM | POA: Diagnosis present

## 2022-10-22 DIAGNOSIS — R109 Unspecified abdominal pain: Secondary | ICD-10-CM

## 2022-10-22 LAB — CBC WITH DIFFERENTIAL/PLATELET
Abs Immature Granulocytes: 0.02 10*3/uL (ref 0.00–0.07)
Basophils Absolute: 0 10*3/uL (ref 0.0–0.1)
Basophils Relative: 1 %
Eosinophils Absolute: 0.3 10*3/uL (ref 0.0–0.5)
Eosinophils Relative: 3 %
HCT: 42.5 % (ref 36.0–46.0)
Hemoglobin: 14.2 g/dL (ref 12.0–15.0)
Immature Granulocytes: 0 %
Lymphocytes Relative: 31 %
Lymphs Abs: 2.4 10*3/uL (ref 0.7–4.0)
MCH: 29.1 pg (ref 26.0–34.0)
MCHC: 33.4 g/dL (ref 30.0–36.0)
MCV: 87.1 fL (ref 80.0–100.0)
Monocytes Absolute: 0.4 10*3/uL (ref 0.1–1.0)
Monocytes Relative: 6 %
Neutro Abs: 4.5 10*3/uL (ref 1.7–7.7)
Neutrophils Relative %: 59 %
Platelets: 298 10*3/uL (ref 150–400)
RBC: 4.88 MIL/uL (ref 3.87–5.11)
RDW: 12.3 % (ref 11.5–15.5)
WBC: 7.6 10*3/uL (ref 4.0–10.5)
nRBC: 0 % (ref 0.0–0.2)

## 2022-10-22 LAB — BASIC METABOLIC PANEL
Anion gap: 10 (ref 5–15)
BUN: 10 mg/dL (ref 6–20)
CO2: 24 mmol/L (ref 22–32)
Calcium: 8.9 mg/dL (ref 8.9–10.3)
Chloride: 103 mmol/L (ref 98–111)
Creatinine, Ser: 0.77 mg/dL (ref 0.44–1.00)
GFR, Estimated: >60 mL/min (ref >60)
Glucose, Bld: 94 mg/dL (ref 70–99)
Potassium: 3.8 mmol/L (ref 3.5–5.1)
Sodium: 137 mmol/L (ref 135–145)

## 2022-10-22 MED ORDER — MORPHINE 100MG IN NS 100ML (1MG/ML) PREMIX INFUSION: 0.0000 mg/h | INTRAVENOUS | Status: DC

## 2022-10-22 MED ORDER — MIDAZOLAM HCL 2 MG/2ML IJ SOLN: 2.0000 mg | INTRAMUSCULAR | Status: DC | PRN

## 2022-10-22 MED ORDER — GLYCOPYRROLATE 0.2 MG/ML IJ SOLN: 0.2000 mg | INTRAMUSCULAR | Status: DC | PRN

## 2022-10-22 MED ORDER — SODIUM CHLORIDE 0.9 % IV SOLN: INTRAVENOUS | Status: DC

## 2022-10-22 MED ORDER — ONDANSETRON 4 MG PO TBDP: 4.0000 mg | ORAL_TABLET | Freq: Four times a day (QID) | ORAL | Status: DC | PRN

## 2022-10-22 MED ORDER — POLYVINYL ALCOHOL 1.4 % OP SOLN: 1.0000 [drp] | Freq: Four times a day (QID) | OPHTHALMIC | Status: DC | PRN

## 2022-10-22 MED ORDER — ONDANSETRON HCL 4 MG/2ML IJ SOLN: 4.0000 mg | Freq: Four times a day (QID) | INTRAMUSCULAR | Status: DC | PRN

## 2022-10-22 MED ORDER — GLYCOPYRROLATE 1 MG PO TABS: 1.0000 mg | ORAL_TABLET | ORAL | Status: DC | PRN

## 2022-10-22 MED ORDER — ACETAMINOPHEN 325 MG RE SUPP: 650.0000 mg | Freq: Four times a day (QID) | RECTAL | Status: DC | PRN

## 2022-10-22 MED ORDER — HALOPERIDOL LACTATE 5 MG/ML IJ SOLN: 2.5000 mg | INTRAMUSCULAR | Status: DC | PRN

## 2022-10-22 MED ORDER — TECHNETIUM TO 99M ALBUMIN AGGREGATED
4.0000 | Freq: Once | INTRAVENOUS | Status: AC | PRN
  Administered 2022-10-22: 4.23 via INTRAVENOUS

## 2022-10-22 MED ORDER — MORPHINE BOLUS VIA INFUSION: 5.0000 mg | INTRAVENOUS | Status: DC | PRN

## 2022-10-22 MED ORDER — ACETAMINOPHEN 325 MG PO TABS: 650.0000 mg | ORAL_TABLET | Freq: Four times a day (QID) | ORAL | Status: DC | PRN

## 2022-10-22 NOTE — ED Notes (Signed)
First nurse note: Pt to the ED from [facility]. Pt is having right lower back pain that radiates to the flank. Pt has hx/o PE, recent d-dimer negative. Pt states that pain is increasing, walk in concerned for PE or gallbladder issues.

## 2022-10-22 NOTE — ED Triage Notes (Addendum)
Pt is here for right upper back/side pain which increases when taking a breath. This began yesterday. Pt reports that she has a hx of blood clots in her left lung in 2022 and this feels similar.

## 2022-10-22 NOTE — ED Provider Notes (Signed)
[facility] Provider Note   Event Date/Time   First MD Initiated Contact with Patient 10/22/22 [phone]     (approximate) History  Back Pain (Right side pain)  HPI Jaclyn Mitchell is a 37 y.o. female with a stated past medical history of PE diagnosed 2022 however not on anticoagulation at this time who presents complaining of right sided posterior flank pain that has been present for 2 weeks.  Patient states that 2 weeks ago she had a D-dimer performed that was negative.  Patient states that the pain has been intermittent until yesterday when it has been stable.  Patient also endorses shortness of breath with exertion and pain worsening with exertion and heavy breathing.  Patient denies any anterior chest pain palpitation.  Patient denies any trauma to the affected side. ROS: Patient currently denies any vision changes, tinnitus, difficulty speaking, facial droop, sore throat, abdominal pain, nausea/vomiting/diarrhea, dysuria, or weakness/numbness/paresthesias in any extremity   Physical Exam  Triage Vital Signs: ED Triage Vitals  Encounter Vitals Group     BP 10/22/22 0839 116/87     Systolic BP Percentile --      Diastolic BP Percentile --      Pulse Rate 10/22/22 0839 82     Resp 10/22/22 0839 16     Temp 10/22/22 0839 98.3 F (36.8 C)     Temp Source 10/22/22 0839 Oral     SpO2 10/22/22 0839 97 %     Weight --      Height --      Head Circumference --      Peak Flow --      Pain Score 10/22/22 0830 5     Pain Loc --      Pain Education --      Exclude from Growth Chart --    Most recent vital signs: Vitals:   10/22/22 1300 10/22/22 1313  BP: 112/70   Pulse: 83   Resp:    Temp:    SpO2:  97%   General: Awake, oriented x4. CV:  Good peripheral perfusion.  Resp:  Normal effort.  Abd:  No distention.  Other:  Middle-aged overweight Caucasian female resting comfortably in no acute distress.  Mildly tender to palpation over the right flank ED  Results / Procedures / Treatments  Labs (all labs ordered are listed, but only abnormal results are displayed) Labs Reviewed  BASIC METABOLIC PANEL  CBC WITH DIFFERENTIAL/PLATELET   EKG ED ECG REPORT I, Merwyn Katos, the attending physician, personally viewed and interpreted this ECG. Date: 10/22/2022 EKG Time: 0832 Rate: 79 Rhythm: normal sinus rhythm QRS Axis: normal Intervals: normal ST/T Wave abnormalities: normal Narrative Interpretation: no evidence of acute ischemia RADIOLOGY ED MD interpretation: 2 view chest x-ray interpreted by me shows no evidence of acute abnormalities including no pneumonia, pneumothorax, or widened mediastinum  Nuclear medicine perfusion scan of the lungs interpreted independently by me and shows pulmonary embolism is absent with a very low probability of PE -Agree with radiology assessment Official radiology report(s): NM Pulmonary Perfusion  Result Date: 10/22/2022 CLINICAL DATA:  History of pulmonary embolism, now with back pain worrisome for recurrent pulmonary embolism. Patient with history anaphylaxis intravenous contracts. Evaluate for pulmonary embolism. EXAM: NUCLEAR MEDICINE PERFUSION LUNG SCAN TECHNIQUE: Perfusion images were obtained in multiple projections after intravenous injection of radiopharmaceutical. Ventilation scans intentionally deferred if perfusion scan and chest x-ray adequate for interpretation during COVID 19 epidemic. RADIOPHARMACEUTICALS:  4.2 mCi Tc-54m MAA IV  COMPARISON:  Chest radiograph-10/22/2022; 12/28/2014 Chest CTA-12/11/2020 FINDINGS: Review of chest radiograph performed earlier same day demonstrates unchanged cardiac silhouette and mediastinal contours. No focal parenchymal opacities. No pleural effusion or pneumothorax. No evidence of edema. Perfusion images demonstrates relative homogeneous distribution of injected radiotracer without discrete segmental or subsegmental area of non perfusion to suggest pulmonary  embolism. IMPRESSION: Pulmonary embolism absent (very low probability of pulmonary embolism). Electronically Signed   By: Simonne Come M.D.   On: 10/22/2022 14:24   DG Chest 2 View  Result Date: 10/22/2022 CLINICAL DATA:  One day history of pleuritic right upper back/side pain EXAM: CHEST - 2 VIEW COMPARISON:  Chest radiograph dated 12/28/2014 FINDINGS: Normal lung volumes. No focal consolidations. No pleural effusion or pneumothorax. The heart size and mediastinal contours are within normal limits. No acute osseous abnormality. IMPRESSION: No active cardiopulmonary disease. Electronically Signed   By: Agustin Cree M.D.   On: 10/22/2022 09:47   PROCEDURES: Critical Care performed: No .1-3 Lead EKG Interpretation  Performed by: Merwyn Katos, MD Authorized by: Merwyn Katos, MD     Interpretation: normal     ECG rate:  71   ECG rate assessment: normal     Rhythm: sinus rhythm     Ectopy: none     Conduction: normal    MEDICATIONS ORDERED IN ED: Medications  technetium albumin aggregated (MAA) injection solution 4 millicurie (4.23 millicuries Intravenous Contrast Given 10/22/22 1139)   IMPRESSION / MDM / ASSESSMENT AND PLAN / ED COURSE  I reviewed the triage vital signs and the nursing notes.                             The patient is on the cardiac monitor to evaluate for evidence of arrhythmia and/or significant heart rate changes. Patient's presentation is most consistent with acute presentation with potential threat to life or bodily function. This patient presents with atypical chest pain, most likely secondary to musculoskeletal injury. Differential diagnosis includes rib fracture, costochondritis, sternal fracture. Low suspicion for ACS, acute PE (PERC negative), pericarditis / myocarditis, thoracic aortic dissection, pneumothorax, pneumonia or other acute infectious process. Presentation not consistent with other acute, emergent causes of chest pain at this time. No indication for  cardiac enzyme testing. Plan to order CXR to evaluate for acute cardiopulmonary causes.  Plan: EKG, CXR, pain control, perfusion scan  chest x-ray and perfusion scan negative for any acute abnormalities Dispo: Discharge home with home care   FINAL CLINICAL IMPRESSION(S) / ED DIAGNOSES   Final diagnoses:  Acute right flank pain   Rx / DC Orders   ED Discharge Orders     None      Note:  This document was prepared using Dragon voice recognition software and may include unintentional dictation errors.   Merwyn Katos, MD 10/22/22 1539

## 2022-10-22 NOTE — Discharge Instructions (Addendum)
Please use ibuprofen (Motrin) up to 800 mg every 8 hours, naproxen (Naprosyn) up to 500 mg every 12 hours, and/or acetaminophen (Tylenol) up to 4 g/day for any continued pain.  Please do not use this medication regimen for longer than 7 days

## 2022-10-22 NOTE — ED Notes (Signed)
Blood drawn and sent to lab.

## 2023-05-04 ENCOUNTER — Other Ambulatory Visit: Payer: Self-pay | Admitting: Nurse Practitioner

## 2023-05-04 DIAGNOSIS — R42 Dizziness and giddiness: Secondary | ICD-10-CM

## 2023-11-19 ENCOUNTER — Other Ambulatory Visit: Payer: Self-pay | Admitting: Orthopedic Surgery

## 2023-11-19 DIAGNOSIS — M25561 Pain in right knee: Secondary | ICD-10-CM

## 2023-11-25 ENCOUNTER — Ambulatory Visit
Admission: RE | Admit: 2023-11-25 | Discharge: 2023-11-25 | Disposition: A | Discharge status: Home / Self Care | Source: Ambulatory Visit | Attending: Orthopedic Surgery | Admitting: Orthopedic Surgery

## 2023-11-25 DIAGNOSIS — M25561 Pain in right knee: Secondary | ICD-10-CM | POA: Insufficient documentation
